# Patient Record
Sex: Male | Born: 1965 | Race: Black or African American | Hispanic: No | Marital: Married | Smoking: Current some day smoker
Health system: Southern US, Community
[De-identification: ages and names within clinical notes are randomized; demographics above are authoritative.]

## PROBLEM LIST (undated history)

## (undated) DIAGNOSIS — G4730 Sleep apnea, unspecified: Secondary | ICD-10-CM

## (undated) DIAGNOSIS — I2510 Atherosclerotic heart disease of native coronary artery without angina pectoris: Secondary | ICD-10-CM

## (undated) DIAGNOSIS — K219 Gastro-esophageal reflux disease without esophagitis: Secondary | ICD-10-CM

## (undated) DIAGNOSIS — R51 Headache: Secondary | ICD-10-CM

## (undated) DIAGNOSIS — K449 Diaphragmatic hernia without obstruction or gangrene: Secondary | ICD-10-CM

## (undated) DIAGNOSIS — I10 Essential (primary) hypertension: Secondary | ICD-10-CM

## (undated) HISTORY — PX: CORONARY ARTERY BYPASS GRAFT: SHX141

## (undated) HISTORY — PX: APPENDECTOMY: SHX54

---

## 2010-09-10 ENCOUNTER — Emergency Department (HOSPITAL_BASED_OUTPATIENT_CLINIC_OR_DEPARTMENT_OTHER)
Admission: EM | Admit: 2010-09-10 | Discharge: 2010-09-11 | Disposition: A | Discharge status: Home / Self Care | Payer: Self-pay | Attending: Emergency Medicine | Admitting: Emergency Medicine

## 2010-09-10 DIAGNOSIS — K219 Gastro-esophageal reflux disease without esophagitis: Secondary | ICD-10-CM | POA: Insufficient documentation

## 2010-09-10 DIAGNOSIS — K449 Diaphragmatic hernia without obstruction or gangrene: Secondary | ICD-10-CM | POA: Insufficient documentation

## 2010-09-10 DIAGNOSIS — E7800 Pure hypercholesterolemia, unspecified: Secondary | ICD-10-CM | POA: Insufficient documentation

## 2010-09-10 DIAGNOSIS — I10 Essential (primary) hypertension: Secondary | ICD-10-CM | POA: Insufficient documentation

## 2010-09-10 DIAGNOSIS — E119 Type 2 diabetes mellitus without complications: Secondary | ICD-10-CM | POA: Insufficient documentation

## 2010-09-10 LAB — DIFFERENTIAL
Basophils Absolute: 0.1 10*3/uL (ref 0.0–0.1)
Basophils Relative: 1 % (ref 0–1)
Eosinophils Absolute: 0.1 10*3/uL (ref 0.0–0.7)
Lymphs Abs: 2.7 10*3/uL (ref 0.7–4.0)
Neutrophils Relative %: 60 % (ref 43–77)

## 2010-09-10 LAB — CBC
MCV: 78.8 fL (ref 78.0–100.0)
Platelets: 335 10*3/uL (ref 150–400)
RBC: 5.24 MIL/uL (ref 4.22–5.81)
WBC: 9.1 10*3/uL (ref 4.0–10.5)

## 2010-09-10 LAB — COMPREHENSIVE METABOLIC PANEL
Alkaline Phosphatase: 162 U/L — ABNORMAL HIGH (ref 39–117)
BUN: 10 mg/dL (ref 6–23)
CO2: 22 mEq/L (ref 19–32)
Chloride: 106 mEq/L (ref 96–112)
Creatinine, Ser: 0.6 mg/dL (ref 0.4–1.5)
GFR calc non Af Amer: >60 mL/min (ref >60)
Total Bilirubin: 1.4 mg/dL — ABNORMAL HIGH (ref 0.3–1.2)

## 2010-09-10 LAB — URINALYSIS, ROUTINE W REFLEX MICROSCOPIC
Leukocytes, UA: NEGATIVE
Nitrite: NEGATIVE
Specific Gravity, Urine: 1.046 — ABNORMAL HIGH (ref 1.005–1.030)
pH: 5.5 (ref 5.0–8.0)

## 2010-09-10 LAB — URINE MICROSCOPIC-ADD ON

## 2011-06-11 ENCOUNTER — Encounter: Payer: Self-pay | Admitting: *Deleted

## 2011-06-11 ENCOUNTER — Emergency Department (INDEPENDENT_AMBULATORY_CARE_PROVIDER_SITE_OTHER): Payer: Self-pay

## 2011-06-11 ENCOUNTER — Inpatient Hospital Stay (HOSPITAL_BASED_OUTPATIENT_CLINIC_OR_DEPARTMENT_OTHER)
Admission: EM | Admit: 2011-06-11 | Discharge: 2011-06-15 | DRG: 683 | Discharge status: Against Medical Advice | Payer: Self-pay | Attending: Internal Medicine | Admitting: Internal Medicine

## 2011-06-11 ENCOUNTER — Other Ambulatory Visit: Payer: Self-pay

## 2011-06-11 DIAGNOSIS — R109 Unspecified abdominal pain: Secondary | ICD-10-CM

## 2011-06-11 DIAGNOSIS — E119 Type 2 diabetes mellitus without complications: Secondary | ICD-10-CM | POA: Diagnosis present

## 2011-06-11 DIAGNOSIS — K7689 Other specified diseases of liver: Secondary | ICD-10-CM

## 2011-06-11 DIAGNOSIS — I2510 Atherosclerotic heart disease of native coronary artery without angina pectoris: Secondary | ICD-10-CM | POA: Diagnosis present

## 2011-06-11 DIAGNOSIS — R935 Abnormal findings on diagnostic imaging of other abdominal regions, including retroperitoneum: Secondary | ICD-10-CM | POA: Diagnosis present

## 2011-06-11 DIAGNOSIS — N50819 Testicular pain, unspecified: Secondary | ICD-10-CM | POA: Diagnosis present

## 2011-06-11 DIAGNOSIS — Z794 Long term (current) use of insulin: Secondary | ICD-10-CM

## 2011-06-11 DIAGNOSIS — D696 Thrombocytopenia, unspecified: Secondary | ICD-10-CM | POA: Diagnosis present

## 2011-06-11 DIAGNOSIS — F17200 Nicotine dependence, unspecified, uncomplicated: Secondary | ICD-10-CM | POA: Diagnosis present

## 2011-06-11 DIAGNOSIS — K219 Gastro-esophageal reflux disease without esophagitis: Secondary | ICD-10-CM | POA: Diagnosis present

## 2011-06-11 DIAGNOSIS — N509 Disorder of male genital organs, unspecified: Secondary | ICD-10-CM | POA: Diagnosis present

## 2011-06-11 DIAGNOSIS — N508 Other specified disorders of male genital organs: Secondary | ICD-10-CM

## 2011-06-11 DIAGNOSIS — E871 Hypo-osmolality and hyponatremia: Secondary | ICD-10-CM | POA: Diagnosis present

## 2011-06-11 DIAGNOSIS — E669 Obesity, unspecified: Secondary | ICD-10-CM | POA: Diagnosis present

## 2011-06-11 DIAGNOSIS — K7460 Unspecified cirrhosis of liver: Secondary | ICD-10-CM | POA: Diagnosis present

## 2011-06-11 DIAGNOSIS — I10 Essential (primary) hypertension: Secondary | ICD-10-CM | POA: Diagnosis present

## 2011-06-11 DIAGNOSIS — K449 Diaphragmatic hernia without obstruction or gangrene: Secondary | ICD-10-CM | POA: Diagnosis present

## 2011-06-11 DIAGNOSIS — G4733 Obstructive sleep apnea (adult) (pediatric): Secondary | ICD-10-CM | POA: Diagnosis present

## 2011-06-11 DIAGNOSIS — N179 Acute kidney failure, unspecified: Principal | ICD-10-CM | POA: Diagnosis present

## 2011-06-11 DIAGNOSIS — R739 Hyperglycemia, unspecified: Secondary | ICD-10-CM

## 2011-06-11 DIAGNOSIS — R509 Fever, unspecified: Secondary | ICD-10-CM | POA: Diagnosis present

## 2011-06-11 DIAGNOSIS — Z951 Presence of aortocoronary bypass graft: Secondary | ICD-10-CM

## 2011-06-11 DIAGNOSIS — IMO0001 Reserved for inherently not codable concepts without codable children: Secondary | ICD-10-CM | POA: Diagnosis present

## 2011-06-11 HISTORY — DX: Essential (primary) hypertension: I10

## 2011-06-11 HISTORY — DX: Diaphragmatic hernia without obstruction or gangrene: K44.9

## 2011-06-11 HISTORY — DX: Sleep apnea, unspecified: G47.30

## 2011-06-11 HISTORY — DX: Gastro-esophageal reflux disease without esophagitis: K21.9

## 2011-06-11 HISTORY — DX: Atherosclerotic heart disease of native coronary artery without angina pectoris: I25.10

## 2011-06-11 HISTORY — DX: Headache: R51

## 2011-06-11 LAB — DIFFERENTIAL
Basophils Absolute: 0 10*3/uL (ref 0.0–0.1)
Basophils Relative: 0 % (ref 0–1)
Eosinophils Absolute: 0 10*3/uL (ref 0.0–0.7)
Eosinophils Relative: 0 % (ref 0–5)
Lymphocytes Relative: 13 % (ref 12–46)
Lymphs Abs: 1.3 10*3/uL (ref 0.7–4.0)
Monocytes Absolute: 1.1 10*3/uL — ABNORMAL HIGH (ref 0.1–1.0)
Monocytes Relative: 11 % (ref 3–12)
Neutro Abs: 7.3 10*3/uL (ref 1.7–7.7)
Neutrophils Relative %: 76 % (ref 43–77)
nRBC: 0 /100 WBC

## 2011-06-11 LAB — CBC
MCHC: 33.2 g/dL (ref 30.0–36.0)
MCV: 82.5 fL (ref 78.0–100.0)
Platelets: 149 10*3/uL — ABNORMAL LOW (ref 150–400)
RDW: 13 % (ref 11.5–15.5)
WBC: 9.7 10*3/uL (ref 4.0–10.5)

## 2011-06-11 LAB — URINALYSIS, ROUTINE W REFLEX MICROSCOPIC
Glucose, UA: >1000 mg/dL — AB
Hgb urine dipstick: NEGATIVE
Leukocytes, UA: NEGATIVE
Protein, ur: NEGATIVE mg/dL
pH: 5 (ref 5.0–8.0)

## 2011-06-11 LAB — CARDIAC PANEL(CRET KIN+CKTOT+MB+TROPI)
CK, MB: 1.8 ng/mL (ref 0.3–4.0)
Relative Index: INVALID (ref 0.0–2.5)
Total CK: 84 U/L (ref 7–232)

## 2011-06-11 LAB — COMPREHENSIVE METABOLIC PANEL
AST: 19 U/L (ref 0–37)
Albumin: 3.7 g/dL (ref 3.5–5.2)
Chloride: 95 mEq/L — ABNORMAL LOW (ref 96–112)
Creatinine, Ser: 0.6 mg/dL (ref 0.50–1.35)
Potassium: 3.9 mEq/L (ref 3.5–5.1)
Total Bilirubin: 0.8 mg/dL (ref 0.3–1.2)
Total Protein: 8.3 g/dL (ref 6.0–8.3)

## 2011-06-11 LAB — GLUCOSE, CAPILLARY
Glucose-Capillary: 347 mg/dL — ABNORMAL HIGH (ref 70–99)
Glucose-Capillary: 212 mg/dL — ABNORMAL HIGH (ref 70–99)

## 2011-06-11 LAB — URINE MICROSCOPIC-ADD ON

## 2011-06-11 MED ORDER — SODIUM CHLORIDE 0.9 % IV BOLUS (SEPSIS): 500.0000 mL | Freq: Once | INTRAVENOUS | Status: DC

## 2011-06-11 MED ORDER — HYDROCODONE-ACETAMINOPHEN 5-500 MG PO TABS: 1.0000 | ORAL_TABLET | Freq: Four times a day (QID) | ORAL | Status: DC | PRN

## 2011-06-11 MED ORDER — IBUPROFEN 800 MG PO TABS
800.0000 mg | ORAL_TABLET | Freq: Once | ORAL | Status: AC
Start: 2011-06-11 — End: 2011-06-11
  Administered 2011-06-11: 800 mg via ORAL
  Filled 2011-06-11: qty 1

## 2011-06-11 MED ORDER — IBUPROFEN 800 MG PO TABS
ORAL_TABLET | ORAL | Status: AC
  Administered 2011-06-11: 800 mg via ORAL
  Filled 2011-06-11: qty 1

## 2011-06-11 MED ORDER — ACETAMINOPHEN 325 MG PO TABS
ORAL_TABLET | ORAL | Status: AC
  Filled 2011-06-11: qty 2

## 2011-06-11 MED ORDER — ACETAMINOPHEN 325 MG PO TABS: 650.0000 mg | ORAL_TABLET | Freq: Once | ORAL | Status: DC

## 2011-06-11 MED ORDER — HYDROMORPHONE HCL PF 1 MG/ML IJ SOLN
1.0000 mg | Freq: Once | INTRAMUSCULAR | Status: AC
  Administered 2011-06-11: 1 mg via INTRAVENOUS
  Filled 2011-06-11: qty 1

## 2011-06-11 MED ORDER — ONDANSETRON HCL 4 MG PO TABS: 4.0000 mg | ORAL_TABLET | Freq: Four times a day (QID) | ORAL | Status: DC

## 2011-06-11 MED ORDER — IBUPROFEN 800 MG PO TABS
800.0000 mg | ORAL_TABLET | Freq: Once | ORAL | Status: AC
  Administered 2011-06-11: 800 mg via ORAL

## 2011-06-11 MED ORDER — SODIUM CHLORIDE 0.9 % IV BOLUS (SEPSIS)
1000.0000 mL | Freq: Once | INTRAVENOUS | Status: AC
  Administered 2011-06-11: 1000 mL via INTRAVENOUS

## 2011-06-11 MED ORDER — ONDANSETRON HCL 4 MG/2ML IJ SOLN
4.0000 mg | Freq: Once | INTRAMUSCULAR | Status: AC
  Administered 2011-06-11: 4 mg via INTRAVENOUS
  Filled 2011-06-11: qty 2

## 2011-06-11 NOTE — ED Notes (Signed)
Beeped regional hospitalist again for dr. Patrica Duel

## 2011-06-11 NOTE — ED Notes (Signed)
2 days of lower abdominal pain and low back pain ..pain radiates into testicle also having nausea pt reports 7 bowel movements since this am loose not diarrhea

## 2011-06-11 NOTE — ED Provider Notes (Addendum)
7:34 PM   Care assumed from Dr. Rosalia Hammers.  Complains of low back pain. Radiation the testicles. Fever of 102.  Nausea, loose stools, diffuse low abd tender. .     Waiting on UA,   CBC and chem normal.  CT  W/o done.  Recommended [place] to eval GB.   US done. Pending.    7:34 PM Results for orders placed during the hospital encounter of 06/11/11  GLUCOSE, CAPILLARY      Component Value Range   Glucose-Capillary 347 (*) 70 - 99 (mg/dL)  CBC      Component Value Range   WBC 9.7  4.0 - 10.5 (K/uL)   RBC 5.59  4.22 - 5.81 (MIL/uL)   Hemoglobin 15.3  13.0 - 17.0 (g/dL)   HCT 08.6  57.8 - 46.9 (%)   MCV 82.5  78.0 - 100.0 (fL)   MCH 27.4  26.0 - 34.0 (pg)   MCHC 33.2  30.0 - 36.0 (g/dL)   RDW 62.9  52.8 - 41.3 (%)   Platelets 149 (*) 150 - 400 (K/uL)  COMPREHENSIVE METABOLIC PANEL      Component Value Range   Sodium 133 (*) 135 - 145 (mEq/L)   Potassium 3.9  3.5 - 5.1 (mEq/L)   Chloride 95 (*) 96 - 112 (mEq/L)   CO2 25  19 - 32 (mEq/L)   Glucose, Bld 394 (*) 70 - 99 (mg/dL)   BUN 8  6 - 23 (mg/dL)   Creatinine, Ser 2.44  0.50 - 1.35 (mg/dL)   Calcium 9.3  8.4 - 01.0 (mg/dL)   Total Protein 8.3  6.0 - 8.3 (g/dL)   Albumin 3.7  3.5 - 5.2 (g/dL)   AST 19  0 - 37 (U/L)   ALT 19  0 - 53 (U/L)   Alkaline Phosphatase 248 (*) 39 - 117 (U/L)   Total Bilirubin 0.8  0.3 - 1.2 (mg/dL)   GFR calc non Af Amer >90  >90 (mL/min)   GFR calc Af Amer >90  >90 (mL/min)  URINALYSIS, ROUTINE W REFLEX MICROSCOPIC      Component Value Range   Color, Urine YELLOW  YELLOW    APPearance CLEAR  CLEAR    Specific Gravity, Urine 1.042 (*) 1.005 - 1.030    pH 5.0  5.0 - 8.0    Glucose, UA >1000 (*) NEGATIVE (mg/dL)   Hgb urine dipstick NEGATIVE  NEGATIVE    Bilirubin Urine NEGATIVE  NEGATIVE    Ketones, ur NEGATIVE  NEGATIVE (mg/dL)   Protein, ur NEGATIVE  NEGATIVE (mg/dL)   Urobilinogen, UA 0.2  0.0 - 1.0 (mg/dL)   Nitrite NEGATIVE  NEGATIVE    Leukocytes, UA NEGATIVE  NEGATIVE   DIFFERENTIAL    Component Value Range   Neutrophils Relative 76  43 - 77 (%)   Lymphocytes Relative 13  12 - 46 (%)   Monocytes Relative 11  3 - 12 (%)   Eosinophils Relative 0  0 - 5 (%)   Basophils Relative 0  0 - 1 (%)   Band Neutrophils 0  0 - 10 (%)   Metamyelocytes Relative 0     Myelocytes 0     Promyelocytes Absolute 0     Blasts 0     nRBC 0  0 (/100 WBC)   Neutro Abs 7.3  1.7 - 7.7 (K/uL)   Lymphs Abs 1.3  0.7 - 4.0 (K/uL)   Monocytes Absolute 1.1 (*) 0.1 - 1.0 (K/uL)   Eosinophils Absolute  0.0  0.0 - 0.7 (K/uL)   Basophils Absolute 0.0  0.0 - 0.1 (K/uL)  LIPASE, BLOOD      Component Value Range   Lipase 20  11 - 59 (U/L)  URINE MICROSCOPIC-ADD ON      Component Value Range   Squamous Epithelial / LPF RARE  RARE    Bacteria, UA RARE  RARE   GLUCOSE, CAPILLARY      Component Value Range   Glucose-Capillary 287 (*) 70 - 99 (mg/dL)  CARDIAC PANEL(CRET KIN+CKTOT+MB+TROPI)      Component Value Range   Total CK 84  7 - 232 (U/L)   CK, MB 1.8  0.3 - 4.0 (ng/mL)   Troponin I <0.30  <0.30 (ng/mL)   Relative Index RELATIVE INDEX IS INVALID  0.0 - 2.5    Ct Abdomen Pelvis Wo Contrast  06/11/2011  *RADIOLOGY REPORT*  Clinical Data: Flank pain radiating to the testicle.  CT ABDOMEN AND PELVIS WITHOUT CONTRAST  Technique:  Multidetector CT imaging of the abdomen and pelvis was performed following the standard protocol without intravenous contrast.  Comparison: None.  Findings: Lung bases are clear.  There is a large calcification near the junction of the inferior vena cava and right atrium. This calcified structure roughly measures 2.6 x 3.1 x 3.6 cm.  It is unclear if the calcification is within the vascular structures. There are calcifications surrounding the lower lobe vessels which could represent old granulomatous disease.  There is no significant pericardial or pleural fluid.  There is mild nodularity of the liver and enlargement of the caudate lobe of the liver.  Findings raise concern for  cirrhosis.  The gallbladder is ill-defined and probably has surrounding fluid or edema.  There is a small amount of fluid around the inferior aspect of the liver and a small amount of fluid in the pelvis.  Normal appearance of the adrenal glands and kidneys.  No evidence for kidney stones or hydronephrosis.  Spleen is normal in morphology and size.  No gross abnormality to the pancreas but there is stranding and prominent lymph nodes in the porta hepatis and surrounding the pancreas.  There is an umbilical and supraumbilical ventral hernia containing fat.  No gross abnormality to the urinary bladder.  Prominent lymph nodes in the right lower quadrant of the abdomen.  Largest node in the right lower mesentery measures 1.7 x 1.6 cm on sequence 93, image 61.  The patient has had a median sternotomy.  No acute bone abnormality.  IMPRESSION: Findings raise concern for cirrhosis.  There is mild nodularity of the liver contour and enlargement of the caudate lobe.  In addition, there is stranding in the porta hepatis with enlarged lymph nodes in the porta hepatis and the right lower abdominal mesentery.  Stranding or edema around the gallbladder.  Findings could be secondary to cirrhosis but cannot exclude acute gallbladder inflammation.  There is a large calcification positioned near the junction of the right atrium and inferior vena cava.  It is unclear if this calcification is intravascular or extrinsic to the vascular structures.  Differential could represent chronic thrombus versus old granulomatous disease.  The liver and gallbladder findings may be further evaluated with an abdominal ultrasound and  Doppler.  Alternately, the liver, hepatic veins and IVC may be further evaluated with a multiphase MRI of the liver.  Ventral hernias containing fat.  Original Report Authenticated By: Richarda Overlie, M.D.   US Abdomen Complete  06/11/2011  *RADIOLOGY REPORT*  Clinical Data:  Abdominal pain  ABDOMINAL ULTRASOUND COMPLETE   Comparison:  CT earlier today  Findings:  Gallbladder:  The gallbladder wall is significantly thickened, measuring up to 7.5 mm.  No gallstones or pericholecystic fluid.  Common Bile Duct:  Within normal limits in caliber.  Liver: The liver is diffusely nodular in echogenicity and contour. No obvious focal mass.  IVC:  Appears normal.  Pancreas:  Obscured by overlying bowel gas  Spleen:  Within normal limits in size and echotexture.  Right kidney:  Normal in size and parenchymal echogenicity.  No evidence of mass or hydronephrosis.  Left kidney:  Normal in size and parenchymal echogenicity.  No evidence of mass or hydronephrosis.  Abdominal Aorta:  Obscured by overlying bowel gas.  IMPRESSION: Limited visualization of the pancreas and aorta.  Nonspecific gallbladder wall thickening.  Nodular and heterogeneous liver worrisome for diffuse hepatic cellular disease or cirrhosis.  Original Report Authenticated By: Donavan Burnet, M.D.      7:34 PM Patient is reassessed in the room. He remains stable, ultrasound, and CAT scan have been discussed with him. [facility] Dr[facility] [place], we'll try to arrange transfer there for further evaluation. Urine analysis shows no sign of any infection but shows likely poorly controlled diabetes  7:34 PM   7:34 PM  Dr Jordan Hawks  Pager #  [phone]  Discuss with the [facility] hospitalist. Of admission pending based on their requests for chest x-ray. Cardiac markers and EKG. This has been ordered, will call back Dr. Jordan Hawks. When these test results   7:34 PM  Date: 06/11/2011  Rate: 103  Rhythm: sinus tachycardia  QRS Axis: normal  Intervals: normal  ST/T Wave abnormalities: nonspecific T wave changes  Conduction Disutrbances:none  Narrative Interpretation:   Old EKG Reviewed: none available  7:34 PM Results for orders placed during the hospital encounter of 06/11/11  GLUCOSE, CAPILLARY      Component Value Range   Glucose-Capillary  347 (*) 70 - 99 (mg/dL)  CBC      Component Value Range   WBC 9.7  4.0 - 10.5 (K/uL)   RBC 5.59  4.22 - 5.81 (MIL/uL)   Hemoglobin 15.3  13.0 - 17.0 (g/dL)   HCT 45.4  09.8 - 11.9 (%)   MCV 82.5  78.0 - 100.0 (fL)   MCH 27.4  26.0 - 34.0 (pg)   MCHC 33.2  30.0 - 36.0 (g/dL)   RDW 14.7  82.9 - 56.2 (%)   Platelets 149 (*) 150 - 400 (K/uL)  COMPREHENSIVE METABOLIC PANEL      Component Value Range   Sodium 133 (*) 135 - 145 (mEq/L)   Potassium 3.9  3.5 - 5.1 (mEq/L)   Chloride 95 (*) 96 - 112 (mEq/L)   CO2 25  19 - 32 (mEq/L)   Glucose, Bld 394 (*) 70 - 99 (mg/dL)   BUN 8  6 - 23 (mg/dL)   Creatinine, Ser 1.30  0.50 - 1.35 (mg/dL)   Calcium 9.3  8.4 - 86.5 (mg/dL)   Total Protein 8.3  6.0 - 8.3 (g/dL)   Albumin 3.7  3.5 - 5.2 (g/dL)   AST 19  0 - 37 (U/L)   ALT 19  0 - 53 (U/L)   Alkaline Phosphatase 248 (*) 39 - 117 (U/L)   Total Bilirubin 0.8  0.3 - 1.2 (mg/dL)   GFR calc non Af Amer >90  >90 (mL/min)   GFR calc Af Amer >90  >90 (mL/min)  URINALYSIS, ROUTINE W REFLEX MICROSCOPIC      Component Value Range   Color, Urine YELLOW  YELLOW    APPearance CLEAR  CLEAR    Specific Gravity, Urine 1.042 (*) 1.005 - 1.030    pH 5.0  5.0 - 8.0    Glucose, UA >1000 (*) NEGATIVE (mg/dL)   Hgb urine dipstick NEGATIVE  NEGATIVE    Bilirubin Urine NEGATIVE  NEGATIVE    Ketones, ur NEGATIVE  NEGATIVE (mg/dL)   Protein, ur NEGATIVE  NEGATIVE (mg/dL)   Urobilinogen, UA 0.2  0.0 - 1.0 (mg/dL)   Nitrite NEGATIVE  NEGATIVE    Leukocytes, UA NEGATIVE  NEGATIVE   DIFFERENTIAL      Component Value Range   Neutrophils Relative 76  43 - 77 (%)   Lymphocytes Relative 13  12 - 46 (%)   Monocytes Relative 11  3 - 12 (%)   Eosinophils Relative 0  0 - 5 (%)   Basophils Relative 0  0 - 1 (%)   Band Neutrophils 0  0 - 10 (%)   Metamyelocytes Relative 0     Myelocytes 0     Promyelocytes Absolute 0     Blasts 0     nRBC 0  0 (/100 WBC)   Neutro Abs 7.3  1.7 - 7.7 (K/uL)   Lymphs Abs 1.3  0.7 -  4.0 (K/uL)   Monocytes Absolute 1.1 (*) 0.1 - 1.0 (K/uL)   Eosinophils Absolute 0.0  0.0 - 0.7 (K/uL)   Basophils Absolute 0.0  0.0 - 0.1 (K/uL)  LIPASE, BLOOD      Component Value Range   Lipase 20  11 - 59 (U/L)  URINE MICROSCOPIC-ADD ON      Component Value Range   Squamous Epithelial / LPF RARE  RARE    Bacteria, UA RARE  RARE   GLUCOSE, CAPILLARY      Component Value Range   Glucose-Capillary 287 (*) 70 - 99 (mg/dL)  CARDIAC PANEL(CRET KIN+CKTOT+MB+TROPI)      Component Value Range   Total CK 84  7 - 232 (U/L)   CK, MB 1.8  0.3 - 4.0 (ng/mL)   Troponin I <0.30  <0.30 (ng/mL)   Relative Index RELATIVE INDEX IS INVALID  0.0 - 2.5    Ct Abdomen Pelvis Wo Contrast  06/11/2011  *RADIOLOGY REPORT*  Clinical Data: Flank pain radiating to the testicle.  CT ABDOMEN AND PELVIS WITHOUT CONTRAST  Technique:  Multidetector CT imaging of the abdomen and pelvis was performed following the standard protocol without intravenous contrast.  Comparison: None.  Findings: Lung bases are clear.  There is a large calcification near the junction of the inferior vena cava and right atrium. This calcified structure roughly measures 2.6 x 3.1 x 3.6 cm.  It is unclear if the calcification is within the vascular structures. There are calcifications surrounding the lower lobe vessels which could represent old granulomatous disease.  There is no significant pericardial or pleural fluid.  There is mild nodularity of the liver and enlargement of the caudate lobe of the liver.  Findings raise concern for cirrhosis.  The gallbladder is ill-defined and probably has surrounding fluid or edema.  There is a small amount of fluid around the inferior aspect of the liver and a small amount of fluid in the pelvis.  Normal appearance of the adrenal glands and kidneys.  No evidence for kidney stones or hydronephrosis.  Spleen is normal in morphology and size.  No gross abnormality  to the pancreas but there is stranding and prominent  lymph nodes in the porta hepatis and surrounding the pancreas.  There is an umbilical and supraumbilical ventral hernia containing fat.  No gross abnormality to the urinary bladder.  Prominent lymph nodes in the right lower quadrant of the abdomen.  Largest node in the right lower mesentery measures 1.7 x 1.6 cm on sequence 93, image 61.  The patient has had a median sternotomy.  No acute bone abnormality.  IMPRESSION: Findings raise concern for cirrhosis.  There is mild nodularity of the liver contour and enlargement of the caudate lobe.  In addition, there is stranding in the porta hepatis with enlarged lymph nodes in the porta hepatis and the right lower abdominal mesentery.  Stranding or edema around the gallbladder.  Findings could be secondary to cirrhosis but cannot exclude acute gallbladder inflammation.  There is a large calcification positioned near the junction of the right atrium and inferior vena cava.  It is unclear if this calcification is intravascular or extrinsic to the vascular structures.  Differential could represent chronic thrombus versus old granulomatous disease.  The liver and gallbladder findings may be further evaluated with an abdominal ultrasound and  Doppler.  Alternately, the liver, hepatic veins and IVC may be further evaluated with a multiphase MRI of the liver.  Ventral hernias containing fat.  Original Report Authenticated By: Richarda Overlie, M.D.   Dg Chest 2 View  06/11/2011  *RADIOLOGY REPORT*  Clinical Data: Abdominal pain for 2 days  CHEST - 2 VIEW  Comparison: CT abdomen pelvis 06/11/2011  Findings: There are changes of median sternotomy. The second and third median sternotomy wires are fractured. Lung volumes are low, with crowding of pulmonary vascularity.  Heart size is within normal limits. The lungs are clear.  There is no pleural effusion or pneumothorax.  Visualized osseous structures are unremarkable.  IMPRESSION: Low lung volumes.  No acute findings.  Original Report  Authenticated By: Britta Mccreedy, M.D.   US Abdomen Complete  06/11/2011  *RADIOLOGY REPORT*  Clinical Data:  Abdominal pain  ABDOMINAL ULTRASOUND COMPLETE  Comparison:  CT earlier today  Findings:  Gallbladder:  The gallbladder wall is significantly thickened, measuring up to 7.5 mm.  No gallstones or pericholecystic fluid.  Common Bile Duct:  Within normal limits in caliber.  Liver: The liver is diffusely nodular in echogenicity and contour. No obvious focal mass.  IVC:  Appears normal.  Pancreas:  Obscured by overlying bowel gas  Spleen:  Within normal limits in size and echotexture.  Right kidney:  Normal in size and parenchymal echogenicity.  No evidence of mass or hydronephrosis.  Left kidney:  Normal in size and parenchymal echogenicity.  No evidence of mass or hydronephrosis.  Abdominal Aorta:  Obscured by overlying bowel gas.  IMPRESSION: Limited visualization of the pancreas and aorta.  Nonspecific gallbladder wall thickening.  Nodular and heterogeneous liver worrisome for diffuse hepatic cellular disease or cirrhosis.  Original Report Authenticated By: Donavan Burnet, M.D.    7:35 PM Discussed with [facility].  Pt has been accepted for admission/transfer.  Stable.   Dr Jordan Hawks, Team 3, Med Surg  Dx. 1.  Abd pain 2.  Cirrhosis 3.  Poorly controlled DM 4.  Fever   Dolores Mcgovern A. Patrica Duel, MD 06/11/11 1943   Remains stable, has been accepted to [facility]. For further evaluation  Blayne Garlick A. Patrica Duel, MD 06/11/11 1950  10:58 PM [facility] is finally here to transport the patient to [facility]. He was  given an additional ibuprofen dose for fever, acetaminophen, avoided due to the cirrhosis. He is also given additional fluid bolus for her persistent tachycardia. As discussed with [facility]. He will likely need a surgical consultation in regard to the abdominal pain, and the abnormal ultrasound. However, he remained stable upon time of transport and will be going to [facility] for further evaluation and  treatment.  Laylanie Kruczek A. Patrica Duel, MD 06/11/11 2258

## 2011-06-11 NOTE — ED Provider Notes (Signed)
History     CSN: [id number] Arrival date & time: 06/11/2011  1:23 PM   First MD Initiated Contact with Patient 06/11/11 1341      Chief Complaint  Patient presents with  . Flank Pain  . Abdominal Pain  . Nausea    (Consider location/radiation/quality/duration/timing/severity/associated sxs/prior treatment) HPI Patient states lower abdomen hurting since yesterday.  No vomiting some nausea.  Low back radiating around to front bilaterally- sharp pain.  Constant- increases with nothing, improves with nothing.  No home meds.  Patient has had multiple loose bowel movements.  Increased frequency of urination.  BS high at 347.  Denies penile discharge, no h.o. Std.   [facility]MD is [facility].   Past Medical History  Diagnosis Date  . Hypertension   . Diabetes mellitus   . Coronary artery disease   . Hiatal hernia     Past Surgical History  Procedure Date  . Coronary artery bypass graft     History reviewed. No pertinent family history.  History  Substance Use Topics  . Smoking status: Current Some Day Smoker  . Smokeless tobacco: Not on file  . Alcohol Use: Yes     occ      Review of Systems  All other systems reviewed and are negative.    Allergies  Review of patient's allergies indicates no known allergies.  Home Medications   Current Outpatient Rx  Name Route Sig Dispense Refill  . CANDESARTAN CILEXETIL 16 MG PO TABS Oral Take 16 mg by mouth daily.      Marland Kitchen GLIPIZIDE 10 MG PO TABS Oral Take 10 mg by mouth 2 (two) times daily before a meal.      . METFORMIN HCL 1000 MG PO TABS Oral Take 1,000 mg by mouth 2 (two) times daily with a meal.        BP 147/79  Pulse 115  Temp(Src) 102.5 F (39.2 C) (Oral)  Resp 20  SpO2 98%  Physical Exam  Constitutional: He is oriented to person, place, and time. He appears well-developed and well-nourished.  HENT:  Head: Normocephalic and atraumatic.  Right Ear: External ear normal.  Left Ear: External ear  normal.  Eyes: Conjunctivae are normal. Pupils are equal, round, and reactive to light.  Neck: Normal range of motion. Neck supple.  Cardiovascular: Normal rate and regular rhythm.   Pulmonary/Chest: Effort normal and breath sounds normal.  Abdominal: Soft. There is tenderness.       Diffuse lower abdominal pain  Genitourinary: Prostate normal and penis normal.  Musculoskeletal: Normal range of motion.  Neurological: He is alert and oriented to person, place, and time.  Skin: Skin is warm and dry.  Psychiatric: He has a normal mood and affect.    ED Course  Procedures (including critical care time)  Labs Reviewed  GLUCOSE, CAPILLARY - Abnormal; Notable for the following:    Glucose-Capillary 347 (*)    All other components within normal limits  CBC  COMPREHENSIVE METABOLIC PANEL   No results found.   No diagnosis found.    MDM  Patient care signed out to Dr. Patrica Duel.         Hilario Quarry, MD 06/16/11 [phone]

## 2011-06-11 NOTE — ED Notes (Signed)
CBG was done at 21:38

## 2011-06-11 NOTE — ED Notes (Signed)
Beeped ----regional hospitalist for dr. Patrica Duel

## 2011-06-11 NOTE — ED Notes (Signed)
repaged hospitalist at regional for dr. Patrica Duel

## 2011-06-12 ENCOUNTER — Encounter (HOSPITAL_COMMUNITY): Payer: Self-pay | Admitting: *Deleted

## 2011-06-12 ENCOUNTER — Inpatient Hospital Stay (HOSPITAL_COMMUNITY): Payer: Self-pay

## 2011-06-12 DIAGNOSIS — I10 Essential (primary) hypertension: Secondary | ICD-10-CM | POA: Diagnosis present

## 2011-06-12 DIAGNOSIS — N50819 Testicular pain, unspecified: Secondary | ICD-10-CM | POA: Diagnosis present

## 2011-06-12 DIAGNOSIS — R509 Fever, unspecified: Secondary | ICD-10-CM | POA: Diagnosis present

## 2011-06-12 DIAGNOSIS — E119 Type 2 diabetes mellitus without complications: Secondary | ICD-10-CM | POA: Diagnosis present

## 2011-06-12 DIAGNOSIS — K7460 Unspecified cirrhosis of liver: Secondary | ICD-10-CM | POA: Diagnosis present

## 2011-06-12 DIAGNOSIS — R109 Unspecified abdominal pain: Secondary | ICD-10-CM | POA: Diagnosis present

## 2011-06-12 DIAGNOSIS — R935 Abnormal findings on diagnostic imaging of other abdominal regions, including retroperitoneum: Secondary | ICD-10-CM | POA: Diagnosis present

## 2011-06-12 LAB — COMPREHENSIVE METABOLIC PANEL
ALT: 16 U/L (ref 0–53)
Albumin: 3 g/dL — ABNORMAL LOW (ref 3.5–5.2)
Alkaline Phosphatase: 153 U/L — ABNORMAL HIGH (ref 39–117)
Glucose, Bld: 291 mg/dL — ABNORMAL HIGH (ref 70–99)
Total Protein: 7 g/dL (ref 6.0–8.3)

## 2011-06-12 LAB — CBC
HCT: 43.4 % (ref 39.0–52.0)
MCHC: 33.6 g/dL (ref 30.0–36.0)
MCV: 83.6 fL (ref 78.0–100.0)
RDW: 13.1 % (ref 11.5–15.5)

## 2011-06-12 LAB — GLUCOSE, CAPILLARY
Glucose-Capillary: 256 mg/dL — ABNORMAL HIGH (ref 70–99)
Glucose-Capillary: 259 mg/dL — ABNORMAL HIGH (ref 70–99)
Glucose-Capillary: 275 mg/dL — ABNORMAL HIGH (ref 70–99)
Glucose-Capillary: 285 mg/dL — ABNORMAL HIGH (ref 70–99)
Glucose-Capillary: 366 mg/dL — ABNORMAL HIGH (ref 70–99)

## 2011-06-12 LAB — HEMOGLOBIN A1C: Hgb A1c MFr Bld: 12.4 % — ABNORMAL HIGH (ref <5.7)

## 2011-06-12 MED ORDER — TECHNETIUM TC 99M MEBROFENIN IV KIT
5.0000 | PACK | Freq: Once | INTRAVENOUS | Status: AC | PRN
  Administered 2011-06-12: 5 via INTRAVENOUS

## 2011-06-12 MED ORDER — GUAIFENESIN-DM 100-10 MG/5ML PO SYRP
5.0000 mL | ORAL_SOLUTION | ORAL | Status: DC | PRN
  Filled 2011-06-12: qty 5

## 2011-06-12 MED ORDER — ALBUTEROL SULFATE (5 MG/ML) 0.5% IN NEBU
2.5000 mg | INHALATION_SOLUTION | RESPIRATORY_TRACT | Status: DC | PRN
  Filled 2011-06-12: qty 0.5

## 2011-06-12 MED ORDER — INSULIN ASPART 100 UNIT/ML ~~LOC~~ SOLN
0.0000 [IU] | SUBCUTANEOUS | Status: DC
  Administered 2011-06-12: 7 [IU] via SUBCUTANEOUS
  Administered 2011-06-12: 5 [IU] via SUBCUTANEOUS
  Administered 2011-06-12: 9 [IU] via SUBCUTANEOUS
  Administered 2011-06-12: 5 [IU] via SUBCUTANEOUS
  Administered 2011-06-13: 2 [IU] via SUBCUTANEOUS
  Administered 2011-06-13 (×2): 7 [IU] via SUBCUTANEOUS
  Administered 2011-06-13: 2 [IU] via SUBCUTANEOUS
  Administered 2011-06-13 – 2011-06-14 (×2): 5 [IU] via SUBCUTANEOUS
  Administered 2011-06-14: 3 [IU] via SUBCUTANEOUS
  Administered 2011-06-14: 2 [IU] via SUBCUTANEOUS
  Administered 2011-06-14 – 2011-06-15 (×7): 3 [IU] via SUBCUTANEOUS
  Filled 2011-06-12 (×3): qty 3

## 2011-06-12 MED ORDER — ONDANSETRON HCL 4 MG PO TABS: 4.0000 mg | ORAL_TABLET | Freq: Four times a day (QID) | ORAL | Status: DC | PRN

## 2011-06-12 MED ORDER — ONDANSETRON HCL 4 MG/2ML IJ SOLN
4.0000 mg | Freq: Four times a day (QID) | INTRAMUSCULAR | Status: DC | PRN
  Filled 2011-06-12: qty 2

## 2011-06-12 MED ORDER — GADOBENATE DIMEGLUMINE 529 MG/ML IV SOLN
20.0000 mL | Freq: Once | INTRAVENOUS | Status: AC | PRN
  Administered 2011-06-12: 20 mL via INTRAVENOUS

## 2011-06-12 MED ORDER — MORPHINE SULFATE 2 MG/ML IJ SOLN
2.0000 mg | INTRAMUSCULAR | Status: DC | PRN
  Administered 2011-06-12: 2 mg via INTRAVENOUS
  Filled 2011-06-12: qty 1

## 2011-06-12 MED ORDER — HYDROMORPHONE HCL PF 1 MG/ML IJ SOLN
1.0000 mg | INTRAMUSCULAR | Status: DC | PRN
  Administered 2011-06-12 – 2011-06-15 (×11): 1 mg via INTRAVENOUS
  Filled 2011-06-12 (×12): qty 1

## 2011-06-12 MED ORDER — HYDROMORPHONE HCL PF 1 MG/ML IJ SOLN
INTRAMUSCULAR | Status: AC
  Administered 2011-06-12: 14:00:00
  Filled 2011-06-12: qty 1

## 2011-06-12 MED ORDER — SODIUM CHLORIDE 0.9 % IV SOLN
INTRAVENOUS | Status: AC
  Administered 2011-06-12: 04:00:00 via INTRAVENOUS

## 2011-06-12 MED ORDER — PIPERACILLIN-TAZOBACTAM 3.375 G IVPB 30 MIN
3.3750 g | Freq: Three times a day (TID) | INTRAVENOUS | Status: DC
  Administered 2011-06-12 – 2011-06-13 (×6): 3.375 g via INTRAVENOUS
  Filled 2011-06-12 (×8): qty 50

## 2011-06-12 MED ORDER — ALUM & MAG HYDROXIDE-SIMETH 200-200-20 MG/5ML PO SUSP
30.0000 mL | Freq: Four times a day (QID) | ORAL | Status: DC | PRN
  Administered 2011-06-15: 30 mL via ORAL
  Filled 2011-06-12 (×3): qty 30

## 2011-06-12 MED ORDER — MAGNESIUM OXIDE 400 MG PO TABS
400.0000 mg | ORAL_TABLET | Freq: Two times a day (BID) | ORAL | Status: DC
  Administered 2011-06-12 – 2011-06-14 (×5): 400 mg via ORAL
  Filled 2011-06-12 (×7): qty 1

## 2011-06-12 MED ORDER — ACETAMINOPHEN 325 MG PO TABS
650.0000 mg | ORAL_TABLET | Freq: Four times a day (QID) | ORAL | Status: DC | PRN
  Administered 2011-06-12: 650 mg via ORAL
  Filled 2011-06-12 (×3): qty 2

## 2011-06-12 MED ORDER — INSULIN GLARGINE 100 UNIT/ML ~~LOC~~ SOLN
5.0000 [IU] | Freq: Every day | SUBCUTANEOUS | Status: DC
  Administered 2011-06-12 – 2011-06-13 (×2): 5 [IU] via SUBCUTANEOUS
  Filled 2011-06-12 (×2): qty 3

## 2011-06-12 MED ORDER — OLMESARTAN MEDOXOMIL 40 MG PO TABS
40.0000 mg | ORAL_TABLET | Freq: Every day | ORAL | Status: DC
  Administered 2011-06-12 – 2011-06-13 (×2): 40 mg via ORAL
  Filled 2011-06-12 (×3): qty 1

## 2011-06-12 MED ORDER — ACETAMINOPHEN 650 MG RE SUPP
650.0000 mg | Freq: Four times a day (QID) | RECTAL | Status: DC | PRN
  Filled 2011-06-12: qty 1

## 2011-06-12 NOTE — H&P (Signed)
PCP:  [facility]?  Chief Complaint:  Abdominal pain radiating to testicles bilateraly  HPI: Marcus Vance is a 45 y.o. male   has a past medical history of Hypertension; Diabetes mellitus; Coronary artery disease; Hiatal hernia; Sleep apnea; GERD (gastroesophageal reflux disease); and Headache.   Presented with  For past 2 days patient had had abdominal pain in the lower part of his abdomen. His Friday 84 lower back as well as testicles bilaterally he also had had some mild nausea associated this. He was not aware of it but upon presentation to [facility] emergency department he was found to be febrile to 102.3. He had not had any vomiting he had not had right upper quadrant pain. Had not had burning with urination. His urine appear to be an uninfected. CT scan did show some gallbladder wall thickening. Chest x-ray was unremarkable Review of Systems:    Pertinent Positives: Nausea, abdominal pain  Pertinent Negatives: No vomiting, no diarrhea, no urinary complaints, no neurological complaints Otherwise ROS are negative except for above, 10 systems were reviewed  Past Medical History: Past Medical History  Diagnosis Date  . Hypertension   . Diabetes mellitus   . Coronary artery disease   . Hiatal hernia   . Sleep apnea   . GERD (gastroesophageal reflux disease)   . Headache    Past Surgical History  Procedure Date  . Coronary artery bypass graft   . Appendectomy      Medications: Prior to Admission medications   Medication Sig Start Date End Date Taking? Authorizing Provider  candesartan (ATACAND) 16 MG tablet Take 16 mg by mouth daily.     Yes Historical Provider, MD  glipiZIDE (GLUCOTROL) 10 MG tablet Take 10 mg by mouth 2 (two) times daily before a meal.     Yes Historical Provider, MD  metFORMIN (GLUCOPHAGE) 500 MG tablet Take 500 mg by mouth 2 (two) times daily with a meal.     Yes Historical Provider, MD    Allergies:  No Known Allergies  Social History:  reports that he has been smoking.  He does not have any smokeless tobacco history on file. He reports that he drinks about 1.8 ounces of alcohol per week. He reports that he does not use illicit drugs.   Family History: family history includes COPD in his father; Diabetes in his mother; and Hypertension in his mother.    Physical Exam: Patient Vitals for the past 24 hrs:  BP Temp Temp src Pulse Resp SpO2 Height Weight  06/12/11 0010 126/83 mmHg 99.4 F (37.4 C) Oral 123  18  93 % 6' (1.829 m) 137.7 kg (303 lb 9.2 oz)  06/11/11 2312 - 101.7 F (38.7 C) Oral - - - - -  06/11/11 2256 113/79 mmHg - - - 20  97 % - -  06/11/11 2119 118/82 mmHg 100.7 F (38.2 C) Oral 104  20  97 % - -  06/11/11 1956 114/58 mmHg 98.9 F (37.2 C) Oral 90  20  99 % - -  06/11/11 1651 108/58 mmHg 99.3 F (37.4 C) Oral 94  18  99 % - -  06/11/11 1448 106/56 mmHg 102.9 F (39.4 C) Oral 105  22  96 % - -  06/11/11 1314 - 102.5 F (39.2 C) - - - - - -  06/11/11 1255 147/79 mmHg 102 F (38.9 C) Oral 115  20  98 % - -     Alert and Oriented in No Acute distress  Mucous Membranes and Skin: Moist mucous membranes normal skin turgor Head Non traumatic neck supple Heart: Regular rate and rhythm no murmurs appreciated Lungs: Clear to auscultation bilaterally but somewhat distant Abdomen: Obese there is a slight left lower quadrant tenderness no right upper quadrant mass tenderness negative Murphy sign, obese  lower extremities: No clubbing cyanosis or edema Neurologically Grossly intact:  Skin clean Dry and intact no rash:  body mass index is 41.17 kg/(m^2).   Labs on Admission:   [facility] 06/11/11 1340  NA 133*  K 3.9  CL 95*  CO2 25  GLUCOSE 394*  BUN 8  CREATININE 0.60  CALCIUM 9.3  MG --  PHOS --    Basename 06/11/11 1340  AST 19  ALT 19  ALKPHOS 248*  BILITOT 0.8  PROT 8.3  ALBUMIN 3.7    Basename 06/11/11 1340  LIPASE 20  AMYLASE --    Basename 06/11/11 1340  WBC 9.7  NEUTROABS  7.3  HGB 15.3  HCT 46.1  MCV 82.5  PLT 149*    Basename 06/11/11 1846  CKTOTAL 84  CKMB 1.8  CKMBINDEX --  TROPONINI <0.30   No results found for this basename: TSH,T4TOTAL,FREET3,T3FREE,THYROIDAB in the last 72 hours No results found for this basename: VITAMINB12:2,FOLATE:2,FERRITIN:2,TIBC:2,IRON:2,RETICCTPCT:2 in the last 72 hours No results found for this basename: HGBA1C    Estimated Creatinine Clearance: 171.1 ml/min (by C-G formula based on Cr of 0.6). ABG No results found for this basename: phart, pco2, po2, hco3, tco2, acidbasedef, o2sat     No results found for this basename: DDIMER      Blood Culture No results found for this basename: sdes, specrequest, cult, reptstatus       Radiological Exams on Admission: Ct Abdomen Pelvis Wo Contrast  06/11/2011  *RADIOLOGY REPORT*  Clinical Data: Flank pain radiating to the testicle.  CT ABDOMEN AND PELVIS WITHOUT CONTRAST  Technique:  Multidetector CT imaging of the abdomen and pelvis was performed following the standard protocol without intravenous contrast.  Comparison: None.  Findings: Lung bases are clear.  There is a large calcification near the junction of the inferior vena cava and right atrium. This calcified structure roughly measures 2.6 x 3.1 x 3.6 cm.  It is unclear if the calcification is within the vascular structures. There are calcifications surrounding the lower lobe vessels which could represent old granulomatous disease.  There is no significant pericardial or pleural fluid.  There is mild nodularity of the liver and enlargement of the caudate lobe of the liver.  Findings raise concern for cirrhosis.  The gallbladder is ill-defined and probably has surrounding fluid or edema.  There is a small amount of fluid around the inferior aspect of the liver and a small amount of fluid in the pelvis.  Normal appearance of the adrenal glands and kidneys.  No evidence for kidney stones or hydronephrosis.  Spleen is normal in  morphology and size.  No gross abnormality to the pancreas but there is stranding and prominent lymph nodes in the porta hepatis and surrounding the pancreas.  There is an umbilical and supraumbilical ventral hernia containing fat.  No gross abnormality to the urinary bladder.  Prominent lymph nodes in the right lower quadrant of the abdomen.  Largest node in the right lower mesentery measures 1.7 x 1.6 cm on sequence 93, image 61.  The patient has had a median sternotomy.  No acute bone abnormality.  IMPRESSION: Findings raise concern for cirrhosis.  There is mild nodularity of the liver  contour and enlargement of the caudate lobe.  In addition, there is stranding in the porta hepatis with enlarged lymph nodes in the porta hepatis and the right lower abdominal mesentery.  Stranding or edema around the gallbladder.  Findings could be secondary to cirrhosis but cannot exclude acute gallbladder inflammation.  There is a large calcification positioned near the junction of the right atrium and inferior vena cava.  It is unclear if this calcification is intravascular or extrinsic to the vascular structures.  Differential could represent chronic thrombus versus old granulomatous disease.  The liver and gallbladder findings may be further evaluated with an abdominal ultrasound and  Doppler.  Alternately, the liver, hepatic veins and IVC may be further evaluated with a multiphase MRI of the liver.  Ventral hernias containing fat.  Original Report Authenticated By: Richarda Overlie, M.D.   Dg Chest 2 View  06/11/2011  *RADIOLOGY REPORT*  Clinical Data: Abdominal pain for 2 days  CHEST - 2 VIEW  Comparison: CT abdomen pelvis 06/11/2011  Findings: There are changes of median sternotomy. The second and third median sternotomy wires are fractured. Lung volumes are low, with crowding of pulmonary vascularity.  Heart size is within normal limits. The lungs are clear.  There is no pleural effusion or pneumothorax.  Visualized osseous  structures are unremarkable.  IMPRESSION: Low lung volumes.  No acute findings.  Original Report Authenticated By: Britta Mccreedy, M.D.   US Abdomen Complete  06/11/2011  *RADIOLOGY REPORT*  Clinical Data:  Abdominal pain  ABDOMINAL ULTRASOUND COMPLETE  Comparison:  CT earlier today  Findings:  Gallbladder:  The gallbladder wall is significantly thickened, measuring up to 7.5 mm.  No gallstones or pericholecystic fluid.  Common Bile Duct:  Within normal limits in caliber.  Liver: The liver is diffusely nodular in echogenicity and contour. No obvious focal mass.  IVC:  Appears normal.  Pancreas:  Obscured by overlying bowel gas  Spleen:  Within normal limits in size and echotexture.  Right kidney:  Normal in size and parenchymal echogenicity.  No evidence of mass or hydronephrosis.  Left kidney:  Normal in size and parenchymal echogenicity.  No evidence of mass or hydronephrosis.  Abdominal Aorta:  Obscured by overlying bowel gas.  IMPRESSION: Limited visualization of the pancreas and aorta.  Nonspecific gallbladder wall thickening.  Nodular and heterogeneous liver worrisome for diffuse hepatic cellular disease or cirrhosis.  Original Report Authenticated By: Donavan Burnet, M.D.    Assessment/Plan  Present on Admission:  .Fever - unclear etiology at this point the urine clear in all abdomen some pneumonia ultrasound did not show evidence of cholecystitis per se although there is some thickening of the bladder wall. Obtain blood cultures cover broadly with Zosyn at this point  .Abdominal pain of unknown etiology - CT scan did not show anything that could explain and abdominal pain  .Testicular pain - etiology not clear to obtain ultrasound of the scrotum. Zosyn should cover epididymitis  .possible Cholecystitis (without mention of calculus) - wall of gallbladder thickening etiology not clear at this point we'll order HIDA scan .Cirrhosis - patient denies alcohol abuse given his obesity this could be  secondary to [place]. Will also obtain hepatitis serologies  .Diabetes mellitus - hold glipizide hold metformin after CT scan continue sliding scale  .Hypertension - continue home meds     Prophylaxis: SCDs  CODE STATUS: Full code   Monea Pesantez 06/12/2011, 3:01 AM

## 2011-06-12 NOTE — Progress Notes (Addendum)
PROGRESS NOTE  Marcus Vance WUJ:[id number] DOB: 1965/12/05 DOA: 06/11/2011 PCP: [facility]  Brief narrative: 45 year old man presents the emergency department with 2 days of lower count and low back pain radiating into his testicle, nausea, diarrhea, fever. Emergency department impression was: abdominal pain, cirrhosis, fever 102.3. The patient was transferred to [facility] for further evaluation. Exam was notable for slight left lower quadrant tenderness. No right upper quadrant tenderness. CT of the abdomen and pelvis was concerning for cirrhosis, edema surrounding the gallbladder, large calcification near the junction of the inferior vena cava and right atrium, prominent lymph nodes in the porta hepatis and surrounding the pancreas, prominent lymph nodes right lower quadrant of the abdomen.  Past medical history: Hypertension, diabetes mellitus, hiatal hernia, obstructive sleep apnea, GERD  Consultants:  None  Procedures:  None  Interim History: Interim documentation reviewed. Subjective: He continues to have midline lower abdominal pain, suprapubic pain. He is able to ambulate without difficulty. He has nausea. He complains of diarrhea.  Objective: Filed Vitals:   06/12/11 0613  BP: 130/84  Pulse: 101  Temp: 98.4 F (36.9 C)  Resp: 20   Blood pressure 130/84, pulse 101, temperature 98.4 F (36.9 C), temperature source Oral, resp. rate 20, height 6' (1.829 m), weight 137.7 kg (303 lb 9.2 oz), SpO2 96.00%.  No intake or output data in the 24 hours ending 06/12/11 0842  Exam: General: Calm. He appears mildly uncomfortable. Lying flat in bed. Cardiovascular: Tachycardic, regular rhythm. No murmur, rub or gallop. No lower extremity edema. Respiratory: Clear to auscultation bilaterally. No wheezes, rales or rhonchi. Normal respiratory effort. Abdomen: Soft, obese, nondistended. No right upper quadrant or epigastric pain. No rebound or guarding all 4  quadrants. No left upper quadrant or right lower quadrant pain. Mild tenderness to palpation of the suprapubic area and low midline abdomen. However no guarding.  Data Reviewed: Basic Metabolic Panel:  Lab 06/13/11 9562 06/12/11 0600 06/11/11 1340  NA 125* 133* 133*  K 3.4* 3.7 --  CL 93* 101 95*  CO2 19 21 25   GLUCOSE 336* 291* 394*  BUN 15 12 8   CREATININE 1.51* 1.03 0.60  CALCIUM 8.1* 8.4 9.3  MG -- 1.2* --  PHOS -- 3.8 --   Liver Function Tests:  Lab 06/13/11 0630 06/12/11 0600 06/11/11 1340  AST 25 18 19   ALT 19 16 19   ALKPHOS 157* 153* 248*  BILITOT 0.9 1.7* 0.8  PROT 7.5 7.0 8.3  ALBUMIN 2.7* 3.0* 3.7    Lab 06/11/11 1340  LIPASE 20  AMYLASE --   CBC:  Lab 06/13/11 0630 06/12/11 0600 06/11/11 1340  WBC 7.7 10.9* 9.7  NEUTROABS -- -- 7.3  HGB 14.6 14.6 15.3  HCT 43.2 43.4 46.1  MCV 83.1 83.6 82.5  PLT 124* 141* 149*   Cardiac Enzymes:  Lab 06/11/11 1846  CKTOTAL 84  CKMB 1.8  CKMBINDEX --  TROPONINI <0.30   CBG:  Lab 06/13/11 1210 06/13/11 0802 06/13/11 0343 06/12/11 2332 06/12/11 2014  GLUCAP 277* 318* 338* 259* 366*    Studies: Dg Chest 2 View  06/11/2011  *RADIOLOGY REPORT*  Clinical Data: Abdominal pain for 2 days  CHEST - 2 VIEW  Comparison: CT abdomen pelvis 06/11/2011  Findings: There are changes of median sternotomy. The second and third median sternotomy wires are fractured. Lung volumes are low, with crowding of pulmonary vascularity.  Heart size is within normal limits. The lungs are clear.  There is no pleural effusion or  pneumothorax.  Visualized osseous structures are unremarkable.  IMPRESSION: Low lung volumes.  No acute findings.  Original Report Authenticated By: Britta Mccreedy, M.D.   Nm Hepatobiliary Liver Func  06/12/2011  *RADIOLOGY REPORT*  Clinical Data:  Concern for cholecystitis.  NUCLEAR MEDICINE HEPATOBILIARY IMAGING  Technique:  Sequential images of the abdomen were obtained out to 60 minutes following intravenous  administration of radiopharmaceutical.  Radiopharmaceutical:  4.5 mCi Tc-77m Choletec  Comparison:  Ultrasound 06/11/2011  Findings: There is prompt clearance of radiotracer from the blood pool and homogeneous uptake in the liver.  The gallbladder is evident by 30 minutes.  Counts evident in the bowel by 15 minutes.  IMPRESSION: No evidence of cholecystitis.  Patent cystic duct and common bile duct.  Original Report Authenticated By: Genevive Bi, M.D.   US Scrotum  06/12/2011  *RADIOLOGY REPORT*  Clinical Data:  Testicular pain  SCROTAL ULTRASOUND DOPPLER ULTRASOUND OF THE TESTICLES  Technique: Complete ultrasound examination of the testicles, epididymis, and other scrotal structures was performed.  Color and spectral Doppler ultrasound were also utilized to evaluate blood flow to the testicles.  Comparison:  None  Findings: Both testicles have a normal size and echotexture.  The right testicle measures 4.0 x 2.2 x 3.1 cm, the left testicle measures 4.1 x 2.0 x 2.7 cm.  There is no evidence of testicular mass or hydrocele.  Both epididymi have a normal appearance with incidental note of a 5 mm simple cyst on the left.  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  Pulsed Doppler interrogation of both testes demonstrates symmetric, low resistance flow bilaterally.  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  Original Report Authenticated By: Brandon Melnick, M.D.   Mr Liver W Wo Contrast  06/13/2011  *RADIOLOGY REPORT*  Clinical Data: Cirrhosis.  IVC calcification.  MRI ABDOMEN WITH AND WITHOUT CONTRAST  Technique:  Multiplanar multisequence MR imaging of the abdomen was performed both before and after administration of intravenous contrast.  Contrast: 20mL MULTIHANCE GADOBENATE DIMEGLUMINE 529 MG/ML IV SOLN  Comparison: CT scan 06/11/2011.  Findings: There are cirrhotic changes involving the liver with an irregular contour or and increased caudate to right  lobe ratio. There is of hepatic fibrosis are noted but no focal worrisome hepatic mass lesion.  No intrahepatic biliary dilatation.  The gallbladder is unremarkable.  No gallstones or inflammatory changes.  No common bile duct dilatation.  There are dense calcification involving the IVC as noted on the CT scan.  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  However, the hepatic veins are patent and although the IVC lumen is markedly narrowed the IVC is still patent. There are fairly extensive paraspinal collaterals and azygos venous prominence.  There are portal venous collaterals.  The portal and splenic veins are patent. There are enlarged periportal and celiac axis lymph nodes likely due to cirrhosis.  The pancreas is normal.  The spleen is normal in size.  No focal lesions.  Adrenal glands and kidneys are unremarkable.  A right renal cyst and scarring change are noted.  The aorta is normal in caliber.  The major branch vessels are patent.  IMPRESSION:  1.  Cirrhotic changes without worrisome hepatic lesions. 2.  Portal venous collaterals but no splenomegaly or ascites.  The portal and splenic veins are patent. 3.  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  The hepatic veins are patent.  Paraspinal and azygos collaterals are noted.  Original  Report Authenticated By: P. Loralie Champagne, M.D.   Scheduled Meds:    . acetaminophen  650 mg Oral Once  .  HYDROmorphone (DILAUDID) injection  0.5 mg Intravenous Once  . insulin aspart  0-9 Units Subcutaneous Q4H  . insulin glargine  5 Units Subcutaneous Daily  . magnesium oxide  400 mg Oral BID  . olmesartan  40 mg Oral Daily  . piperacillin-tazobactam  3.375 g Intravenous Q8H   Continuous Infusions:   . sodium chloride 75 mL/hr at 06/12/11 0404     Assessment/Plan: 1. Fever: Etiology unclear. Remains nontoxic. Blood cultures no growth today. Started empirically on Zosyn. However now has renal  failure. Not clear whether related however there is no clear source of infection. Will discontinue Zosyn.  2. Acute renal failure: Etiology and significance unclear at this point. Repeat a basic metabolic panel in the morning. Repeat urinalysis. Abdominal ultrasound November 30 revealed normal kidneys. Differential would include medication-induced (Zosyn, Benicar) or contrast-induced. If his kidney function worsens would consider nephrology consultation. 3. Abdominal pain: Etiology remains unclear. Patient does have diarrhea (C. difficile PCR negative) and some nausea. Abdominal ultrasound and HIDA scans were negative. CT of the abdomen and pelvis without contrast was unrevealing and as was MRI of the liver. Consider GI consultation given his diarrhea. Consider repeat CT scan with oral contrast. His abdominal exam however does remain benign. There is no evidence of an acute surgical issue. Cannot exclude diverticulitis at this point although his exam and presentation is not highly suggestive of this. We will place him on empiric Rocephin and Flagyl. 4. Hyponatremia: Possibly related to #2. Start normal saline. 5. Thrombocytopenia: Etiology unclear. Patient has not been on heparin. Repeat CBC in the morning. 6. New diagnosis of cirrhosis: Etiology unclear. Hepatitis serologies negative. Alcohol abuse denied. He will need gastroenterology evaluation. 7. Testicular pain: Presumably referred pain from the abdomen. Workup was unremarkable.. 8. 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 9. Isolated elevation of alkaline phosphatase: This was also seen 09/10/2010. Etiology significance unclear. 10. Diabetes mellitus, uncontrolled: Poor control. Metformin on hold while in hospital. Hold glipizide for now as his oral intake is limited. Increase Lantus. Continue sliding scale every 4 hours.  Complex case. Difficult to tie multiple acute issues  together. He remains nontoxic and evidence for bacterial infection remains lacking. His only localizing symptoms are his abdominal pain and diarrhea. Continue to follow blood cultures. Gastroenterology evaluation.  Code Status: Full  Disposition Plan: Pending further evaluation.   Brendia Sacks, MD  [facility] Pager [phone] 06/12/2011, 8:42 AM    LOS: 1 day

## 2011-06-13 LAB — TSH: TSH: 2.131 u[IU]/mL (ref 0.350–4.500)

## 2011-06-13 LAB — GLUCOSE, CAPILLARY
Glucose-Capillary: 338 mg/dL — ABNORMAL HIGH (ref 70–99)
Glucose-Capillary: 318 mg/dL — ABNORMAL HIGH (ref 70–99)
Glucose-Capillary: 277 mg/dL — ABNORMAL HIGH (ref 70–99)
Glucose-Capillary: 192 mg/dL — ABNORMAL HIGH (ref 70–99)
Glucose-Capillary: 168 mg/dL — ABNORMAL HIGH (ref 70–99)

## 2011-06-13 LAB — COMPREHENSIVE METABOLIC PANEL
AST: 25 U/L (ref 0–37)
Albumin: 2.7 g/dL — ABNORMAL LOW (ref 3.5–5.2)
BUN: 15 mg/dL (ref 6–23)
CO2: 19 mEq/L (ref 19–32)
Glucose, Bld: 336 mg/dL — ABNORMAL HIGH (ref 70–99)
Potassium: 3.4 mEq/L — ABNORMAL LOW (ref 3.5–5.1)
Sodium: 125 mEq/L — ABNORMAL LOW (ref 135–145)
Total Bilirubin: 0.9 mg/dL (ref 0.3–1.2)

## 2011-06-13 LAB — CBC
HCT: 43.2 % (ref 39.0–52.0)
Hemoglobin: 14.6 g/dL (ref 13.0–17.0)
MCH: 28.1 pg (ref 26.0–34.0)
RBC: 5.2 MIL/uL (ref 4.22–5.81)

## 2011-06-13 LAB — CLOSTRIDIUM DIFFICILE BY PCR: Toxigenic C. Difficile by PCR: NEGATIVE

## 2011-06-13 MED ORDER — OXYCODONE HCL 5 MG PO TABS
5.0000 mg | ORAL_TABLET | ORAL | Status: DC | PRN
  Administered 2011-06-14: 5 mg via ORAL
  Filled 2011-06-13: qty 1

## 2011-06-13 MED ORDER — SODIUM CHLORIDE 0.9 % IV SOLN
INTRAVENOUS | Status: DC
  Administered 2011-06-14 – 2011-06-15 (×5): via INTRAVENOUS

## 2011-06-13 MED ORDER — METRONIDAZOLE IN NACL 5-0.79 MG/ML-% IV SOLN
500.0000 mg | Freq: Four times a day (QID) | INTRAVENOUS | Status: DC
  Administered 2011-06-13 – 2011-06-15 (×8): 500 mg via INTRAVENOUS
  Filled 2011-06-13 (×12): qty 100

## 2011-06-13 MED ORDER — INSULIN GLARGINE 100 UNIT/ML ~~LOC~~ SOLN
10.0000 [IU] | Freq: Every day | SUBCUTANEOUS | Status: DC
  Administered 2011-06-14: 10 [IU] via SUBCUTANEOUS
  Filled 2011-06-13: qty 3

## 2011-06-13 MED ORDER — IBUPROFEN 600 MG PO TABS
600.0000 mg | ORAL_TABLET | Freq: Four times a day (QID) | ORAL | Status: DC | PRN
  Filled 2011-06-13: qty 1

## 2011-06-13 MED ORDER — HYDROMORPHONE HCL PF 1 MG/ML IJ SOLN
0.5000 mg | Freq: Once | INTRAMUSCULAR | Status: AC
  Administered 2011-06-13: 0.5 mg via INTRAVENOUS

## 2011-06-13 MED ORDER — ACETAMINOPHEN 325 MG PO TABS: 650.0000 mg | ORAL_TABLET | Freq: Four times a day (QID) | ORAL | Status: DC | PRN

## 2011-06-13 MED ORDER — DEXTROSE 5 % IV SOLN
1.0000 g | INTRAVENOUS | Status: DC
  Administered 2011-06-13 – 2011-06-14 (×2): 1 g via INTRAVENOUS
  Filled 2011-06-13 (×3): qty 10

## 2011-06-14 LAB — COMPREHENSIVE METABOLIC PANEL
ALT: 21 U/L (ref 0–53)
Albumin: 2.6 g/dL — ABNORMAL LOW (ref 3.5–5.2)
Alkaline Phosphatase: 163 U/L — ABNORMAL HIGH (ref 39–117)
Calcium: 8.1 mg/dL — ABNORMAL LOW (ref 8.4–10.5)
GFR calc Af Amer: >90 mL/min (ref >90)
Potassium: 3.7 mEq/L (ref 3.5–5.1)
Sodium: 130 mEq/L — ABNORMAL LOW (ref 135–145)
Total Protein: 6.9 g/dL (ref 6.0–8.3)

## 2011-06-14 LAB — GLUCOSE, CAPILLARY
Glucose-Capillary: 218 mg/dL — ABNORMAL HIGH (ref 70–99)
Glucose-Capillary: 222 mg/dL — ABNORMAL HIGH (ref 70–99)
Glucose-Capillary: 218 mg/dL — ABNORMAL HIGH (ref 70–99)

## 2011-06-14 LAB — URINE MICROSCOPIC-ADD ON

## 2011-06-14 LAB — URINALYSIS, ROUTINE W REFLEX MICROSCOPIC
Bilirubin Urine: NEGATIVE
Nitrite: NEGATIVE
Specific Gravity, Urine: 1.012 (ref 1.005–1.030)
Urobilinogen, UA: 0.2 mg/dL (ref 0.0–1.0)
pH: 5.5 (ref 5.0–8.0)

## 2011-06-14 LAB — CBC
MCH: 27.3 pg (ref 26.0–34.0)
MCHC: 33.1 g/dL (ref 30.0–36.0)
Platelets: 138 10*3/uL — ABNORMAL LOW (ref 150–400)
RDW: 13.1 % (ref 11.5–15.5)

## 2011-06-14 LAB — IRON AND TIBC
Saturation Ratios: 14 % — ABNORMAL LOW (ref 20–55)
UIBC: 188 ug/dL (ref 125–400)

## 2011-06-14 LAB — CREATININE, URINE, RANDOM: Creatinine, Urine: 98.88 mg/dL

## 2011-06-14 LAB — FERRITIN: Ferritin: 433 ng/mL — ABNORMAL HIGH (ref 22–322)

## 2011-06-14 LAB — SODIUM, URINE, RANDOM: Sodium, Ur: <10 mEq/L

## 2011-06-14 MED ORDER — INSULIN GLARGINE 100 UNIT/ML ~~LOC~~ SOLN
15.0000 [IU] | Freq: Every day | SUBCUTANEOUS | Status: DC
  Administered 2011-06-15: 15 [IU] via SUBCUTANEOUS
  Filled 2011-06-14 (×3): qty 3

## 2011-06-14 NOTE — Consult Note (Signed)
[facility] Consult Note  Referring Provider: No ref. provider found Primary Care Physician:  No primary provider on file. Primary Gastroenterologist:  Dr.  Antony Contras Complaint: Fever and lower abdominal pain HPI: Marcus Vance is an 45 y.o. black male.  He was admitted on 11:30 with diffuse lower abdominal pain fevers to 102. For details please see admission history and physical. He experiences intermittent bilateral lower quadrant abdominal pain with some radiation to the testicles. During his evaluation he had an abdominal ultrasound which showed a markedly thickened gallbladder. CT scan likewise showed a thickened gallbladder and a nodular liver suggestive of cirrhosis as well as some mild adenopathy in the mesentery. MRI was done as well as Dopplers which confirmed impression of cirrhosis with normal patent hepatic and portal veins. Interestingly the MRI said the gallbladder appeared normal. Hepatitis scan was done which was normal. The patient denies any pain in the epigastric or right upper quadrant location. He is had an elevated alkaline phosphatase but his other liver function tests have been normal.  He denies any personal or family history of cirrhosis jaundice or hepatitis. He denies significant alcohol use any IV drug use tattoos blood transfusion or suspicious medication or supplement history. He has been somewhat obese most of his life.  Past Medical History  Diagnosis Date  . Hypertension   . Diabetes mellitus   . Coronary artery disease   . Hiatal hernia   . Sleep apnea   . GERD (gastroesophageal reflux disease)   . Headache     Past Surgical History  Procedure Date  . Coronary artery bypass graft   . Appendectomy     Medications Prior to Admission  Medication Dose Route Frequency Provider Last Rate Last Dose  . 0.9 %  sodium chloride infusion   Intravenous Continuous Anastassia Doutova 75 mL/hr at 06/12/11 0404    . 0.9 %  sodium chloride infusion    Intravenous Continuous Pandora Leiter 150 mL/hr at 06/14/11 4540    . acetaminophen (TYLENOL) tablet 650 mg  650 mg Oral Q6H PRN Pandora Leiter      . albuterol (PROVENTIL) (5 MG/ML) 0.5% nebulizer solution 2.5 mg  2.5 mg Nebulization Q2H PRN Anastassia Doutova      . alum & mag hydroxide-simeth (MAALOX/MYLANTA) 200-200-20 MG/5ML suspension 30 mL  30 mL Oral Q6H PRN Anastassia Doutova      . cefTRIAXone (ROCEPHIN) 1 g in dextrose 5 % 50 mL IVPB  1 g Intravenous Q24H Pandora Leiter   1 g at 06/13/11 2018  . gadobenate dimeglumine (MULTIHANCE) injection 20 mL  20 mL Intravenous Once PRN Medication Radiologist   20 mL at 06/12/11 1526  . guaiFENesin-dextromethorphan (ROBITUSSIN DM) 100-10 MG/5ML syrup 5 mL  5 mL Oral Q4H PRN Anastassia Doutova      . HYDROmorphone (DILAUDID) 1 MG/ML injection           . HYDROmorphone (DILAUDID) injection 0.5 mg  0.5 mg Intravenous Once Rolan Lipa   0.5 mg at 06/13/11 0231  . HYDROmorphone (DILAUDID) injection 1 mg  1 mg Intravenous Once Hilario Quarry, MD   1 mg at 06/11/11 1452  . HYDROmorphone (DILAUDID) injection 1 mg  1 mg Intravenous Once Peter A. Patrica Duel, MD   1 mg at 06/11/11 1955  . HYDROmorphone (DILAUDID) injection 1 mg  1 mg Intravenous Q4H PRN Anastassia Doutova   1 mg at 06/14/11 0559  . ibuprofen (ADVIL,MOTRIN) tablet 800 mg  800 mg Oral Once [employer]  Denny Levy, MD   800 mg at 06/11/11 1519  . ibuprofen (ADVIL,MOTRIN) tablet 800 mg  800 mg Oral Once Peter A. Patrica Duel, MD   800 mg at 06/11/11 2129  . insulin aspart (novoLOG) injection 0-9 Units  0-9 Units Subcutaneous Q4H Anastassia Doutova   3 Units at 06/14/11 1226  . insulin glargine (LANTUS) injection 15 Units  15 Units Subcutaneous Daily Pandora Leiter      . metroNIDAZOLE (FLAGYL) IVPB 500 mg  500 mg Intravenous Q6H Reina Fuse Goodrich   500 mg at 06/14/11 1610  . ondansetron (ZOFRAN) injection 4 mg  4 mg Intravenous Once Hilario Quarry, MD   4 mg at 06/11/11 1452  .  ondansetron (ZOFRAN) tablet 4 mg  4 mg Oral Q6H PRN Anastassia Doutova       Or  . ondansetron (ZOFRAN) injection 4 mg  4 mg Intravenous Q6H PRN Anastassia Doutova      . oxyCODONE (Oxy IR/ROXICODONE) immediate release tablet 5 mg  5 mg Oral Q4H PRN Pandora Leiter   5 mg at 06/14/11 1048  . sodium chloride 0.9 % bolus 1,000 mL  1,000 mL Intravenous Once Hilario Quarry, MD   1,000 mL at 06/11/11 1449  . technetium TC 93M mebrofenin (CHOLETEC) injection 5 milli Curie  5 milli Curie Intravenous Once PRN Medication Radiologist   5 milli Curie at 06/12/11 1155  . DISCONTD: acetaminophen (TYLENOL) suppository 650 mg  650 mg Rectal Q6H PRN Anastassia Doutova      . DISCONTD: acetaminophen (TYLENOL) tablet 650 mg  650 mg Oral Once Hilario Quarry, MD      . DISCONTD: acetaminophen (TYLENOL) tablet 650 mg  650 mg Oral Once Hilario Quarry, MD      . DISCONTD: acetaminophen (TYLENOL) tablet 650 mg  650 mg Oral Q6H PRN Anastassia Doutova   650 mg at 06/12/11 1650  . DISCONTD: ibuprofen (ADVIL,MOTRIN) tablet 600 mg  600 mg Oral Q6H PRN Pandora Leiter      . DISCONTD: insulin glargine (LANTUS) injection 10 Units  10 Units Subcutaneous Daily Pandora Leiter   10 Units at 06/14/11 1019  . DISCONTD: insulin glargine (LANTUS) injection 5 Units  5 Units Subcutaneous Daily Pandora Leiter   5 Units at 06/13/11 0900  . DISCONTD: magnesium oxide (MAG-OX) tablet 400 mg  400 mg Oral BID Reina Fuse Goodrich   400 mg at 06/14/11 1019  . DISCONTD: morphine 2 MG/ML injection 2 mg  2 mg Intravenous Q4H PRN Anastassia Doutova   2 mg at 06/12/11 0407  . DISCONTD: olmesartan (BENICAR) tablet 40 mg  40 mg Oral Daily Anastassia Doutova   40 mg at 06/13/11 1038  . DISCONTD: piperacillin-tazobactam (ZOSYN) IVPB 3.375 g  3.375 g Intravenous Q8H Anastassia Doutova   3.375 g at 06/13/11 1320  . DISCONTD: sodium chloride 0.9 % bolus 500 mL  500 mL Intravenous Once Peter A. Patrica Duel, MD       Medications Prior to  Admission  Medication Sig Dispense Refill  . metFORMIN (GLUCOPHAGE) 500 MG tablet Take 500 mg by mouth 2 (two) times daily with a meal.          Allergies: No Known Allergies  Family History  Problem Relation Age of Onset  . Hypertension Mother   . Diabetes Mother   . COPD Father     Social History:  reports that he has been smoking.  He does not have any smokeless tobacco history on  file. He reports that he drinks about 1.8 ounces of alcohol per week. He reports that he does not use illicit drugs.  Negative except as above. Press F2   Blood pressure 116/85, pulse 91, temperature 97.9 F (36.6 C), temperature source Oral, resp. rate 17, height 6' (1.829 m), weight 137.7 kg (303 lb 9.2 oz), SpO2 93.00%. Head: Normocephalic, without obvious abnormality, atraumatic Neck: no adenopathy, no carotid bruit, no JVD, supple, symmetrical, trachea midline and thyroid not enlarged, symmetric, no tenderness/mass/nodules Resp: clear to auscultation bilaterally Cardio: regular rate and rhythm, S1, S2 normal, no murmur, click, rub or gallop GI: Abdomen soft moderately distended with normoactive bowel sounds no hepatomegaly masses or guarding. There is mild diffuse tenderness in lower abdomen. Extremities: extremities normal, atraumatic, no cyanosis or edema  Results for orders placed during the hospital encounter of 06/11/11 (from the past 48 hour(s))  GLUCOSE, CAPILLARY     Status: Abnormal   Collection Time   06/12/11  4:20 PM      Component Value Range Comment   Glucose-Capillary 285 (*) 70 - 99 (mg/dL)    Comment 1 Notify RN     GLUCOSE, CAPILLARY     Status: Abnormal   Collection Time   06/12/11  8:14 PM      Component Value Range Comment   Glucose-Capillary 366 (*) 70 - 99 (mg/dL)   GLUCOSE, CAPILLARY     Status: Abnormal   Collection Time   06/12/11 11:32 PM      Component Value Range Comment   Glucose-Capillary 259 (*) 70 - 99 (mg/dL)   CLOSTRIDIUM DIFFICILE BY PCR     Status:  Normal   Collection Time   06/13/11  3:32 AM      Component Value Range Comment   C difficile by pcr NEGATIVE  NEGATIVE    GLUCOSE, CAPILLARY     Status: Abnormal   Collection Time   06/13/11  3:43 AM      Component Value Range Comment   Glucose-Capillary 338 (*) 70 - 99 (mg/dL)   CBC     Status: Abnormal   Collection Time   06/13/11  6:30 AM      Component Value Range Comment   WBC 7.7  4.0 - 10.5 (K/uL)    RBC 5.20  4.22 - 5.81 (MIL/uL)    Hemoglobin 14.6  13.0 - 17.0 (g/dL)    HCT 16.1  09.6 - 04.5 (%)    MCV 83.1  78.0 - 100.0 (fL)    MCH 28.1  26.0 - 34.0 (pg)    MCHC 33.8  30.0 - 36.0 (g/dL)    RDW 40.9  81.1 - 91.4 (%)    Platelets 124 (*) 150 - 400 (K/uL)   COMPREHENSIVE METABOLIC PANEL     Status: Abnormal   Collection Time   06/13/11  6:30 AM      Component Value Range Comment   Sodium 125 (*) 135 - 145 (mEq/L)    Potassium 3.4 (*) 3.5 - 5.1 (mEq/L)    Chloride 93 (*) 96 - 112 (mEq/L)    CO2 19  19 - 32 (mEq/L)    Glucose, Bld 336 (*) 70 - 99 (mg/dL)    BUN 15  6 - 23 (mg/dL)    Creatinine, Ser 7.82 (*) 0.50 - 1.35 (mg/dL)    Calcium 8.1 (*) 8.4 - 10.5 (mg/dL)    Total Protein 7.5  6.0 - 8.3 (g/dL)    Albumin 2.7 (*) 3.5 - 5.2 (g/dL)  AST 25  0 - 37 (U/L)    ALT 19  0 - 53 (U/L)    Alkaline Phosphatase 157 (*) 39 - 117 (U/L)    Total Bilirubin 0.9  0.3 - 1.2 (mg/dL)    GFR calc non Af Amer 55 (*) >90 (mL/min)    GFR calc Af Amer 64 (*) >90 (mL/min)   GLUCOSE, CAPILLARY     Status: Abnormal   Collection Time   06/13/11  8:02 AM      Component Value Range Comment   Glucose-Capillary 318 (*) 70 - 99 (mg/dL)   GLUCOSE, CAPILLARY     Status: Abnormal   Collection Time   06/13/11 12:10 PM      Component Value Range Comment   Glucose-Capillary 277 (*) 70 - 99 (mg/dL)   GLUCOSE, CAPILLARY     Status: Abnormal   Collection Time   06/13/11  4:56 PM      Component Value Range Comment   Glucose-Capillary 192 (*) 70 - 99 (mg/dL)   GLUCOSE, CAPILLARY     Status:  Abnormal   Collection Time   06/13/11  8:04 PM      Component Value Range Comment   Glucose-Capillary 168 (*) 70 - 99 (mg/dL)   GLUCOSE, CAPILLARY     Status: Abnormal   Collection Time   06/13/11 11:34 PM      Component Value Range Comment   Glucose-Capillary 195 (*) 70 - 99 (mg/dL)   GLUCOSE, CAPILLARY     Status: Abnormal   Collection Time   06/14/11  4:11 AM      Component Value Range Comment   Glucose-Capillary 218 (*) 70 - 99 (mg/dL)   CBC     Status: Abnormal   Collection Time   06/14/11  6:00 AM      Component Value Range Comment   WBC 6.5  4.0 - 10.5 (K/uL)    RBC 5.17  4.22 - 5.81 (MIL/uL)    Hemoglobin 14.1  13.0 - 17.0 (g/dL)    HCT 16.1  09.6 - 04.5 (%)    MCV 82.4  78.0 - 100.0 (fL)    MCH 27.3  26.0 - 34.0 (pg)    MCHC 33.1  30.0 - 36.0 (g/dL)    RDW 40.9  81.1 - 91.4 (%)    Platelets 138 (*) 150 - 400 (K/uL)   COMPREHENSIVE METABOLIC PANEL     Status: Abnormal   Collection Time   06/14/11  6:00 AM      Component Value Range Comment   Sodium 130 (*) 135 - 145 (mEq/L)    Potassium 3.7  3.5 - 5.1 (mEq/L)    Chloride 97  96 - 112 (mEq/L)    CO2 20  19 - 32 (mEq/L)    Glucose, Bld 223 (*) 70 - 99 (mg/dL)    BUN 15  6 - 23 (mg/dL)    Creatinine, Ser 7.82  0.50 - 1.35 (mg/dL)    Calcium 8.1 (*) 8.4 - 10.5 (mg/dL)    Total Protein 6.9  6.0 - 8.3 (g/dL)    Albumin 2.6 (*) 3.5 - 5.2 (g/dL)    AST 33  0 - 37 (U/L) SLIGHT HEMOLYSIS   ALT 21  0 - 53 (U/L)    Alkaline Phosphatase 163 (*) 39 - 117 (U/L)    Total Bilirubin 0.6  0.3 - 1.2 (mg/dL)    GFR calc non Af Amer 85 (*) >90 (mL/min)    GFR calc Af Amer >  90  >90 (mL/min)   URINALYSIS, ROUTINE W REFLEX MICROSCOPIC     Status: Abnormal   Collection Time   06/14/11  6:01 AM      Component Value Range Comment   Color, Urine YELLOW  YELLOW     APPearance CLEAR  CLEAR     Specific Gravity, Urine 1.012  1.005 - 1.030     pH 5.5  5.0 - 8.0     Glucose, UA 100 (*) NEGATIVE (mg/dL)    Hgb urine dipstick TRACE (*)  NEGATIVE     Bilirubin Urine NEGATIVE  NEGATIVE     Ketones, ur NEGATIVE  NEGATIVE (mg/dL)    Protein, ur 30 (*) NEGATIVE (mg/dL)    Urobilinogen, UA 0.2  0.0 - 1.0 (mg/dL)    Nitrite NEGATIVE  NEGATIVE     Leukocytes, UA NEGATIVE  NEGATIVE    SODIUM, URINE, RANDOM     Status: Normal   Collection Time   06/14/11  6:01 AM      Component Value Range Comment   Sodium, Ur <10     CREATININE, URINE, RANDOM     Status: Normal   Collection Time   06/14/11  6:01 AM      Component Value Range Comment   Creatinine, Urine 98.88     URINE MICROSCOPIC-ADD ON     Status: Normal   Collection Time   06/14/11  6:01 AM      Component Value Range Comment   Squamous Epithelial / LPF RARE  RARE     WBC, UA 0-2  <3 (WBC/hpf)    RBC / HPF 0-2  <3 (RBC/hpf)    Bacteria, UA RARE  RARE    GLUCOSE, CAPILLARY     Status: Abnormal   Collection Time   06/14/11  7:59 AM      Component Value Range Comment   Glucose-Capillary 222 (*) 70 - 99 (mg/dL)    Comment 1 Notify RN     GLUCOSE, CAPILLARY     Status: Abnormal   Collection Time   06/14/11  1:45 PM      Component Value Range Comment   Glucose-Capillary 244 (*) 70 - 99 (mg/dL)    Mr Liver W Wo Contrast  06/13/2011  *RADIOLOGY REPORT*  Clinical Data: Cirrhosis.  IVC calcification.  MRI ABDOMEN WITH AND WITHOUT CONTRAST  Technique:  Multiplanar multisequence MR imaging of the abdomen was performed both before and after administration of intravenous contrast.  Contrast: 20mL MULTIHANCE GADOBENATE DIMEGLUMINE 529 MG/ML IV SOLN  Comparison: CT scan 06/11/2011.  Findings: There are cirrhotic changes involving the liver with an irregular contour or and increased caudate to right lobe ratio. There is of hepatic fibrosis are noted but no focal worrisome hepatic mass lesion.  No intrahepatic biliary dilatation.  The gallbladder is unremarkable.  No gallstones or inflammatory changes.  No common bile duct dilatation.  There are dense calcification involving the IVC as noted  on the CT scan.  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  However, the hepatic veins are patent and although the IVC lumen is markedly narrowed the IVC is still patent. There are fairly extensive paraspinal collaterals and azygos venous prominence.  There are portal venous collaterals.  The portal and splenic veins are patent. There are enlarged periportal and celiac axis lymph nodes likely due to cirrhosis.  The pancreas is normal.  The spleen is normal in size.  No focal lesions.  Adrenal glands and kidneys are unremarkable.  A right  renal cyst and scarring change are noted.  The aorta is normal in caliber.  The major branch vessels are patent.  IMPRESSION:  1.  Cirrhotic changes without worrisome hepatic lesions. 2.  Portal venous collaterals but no splenomegaly or ascites.  The portal and splenic veins are patent. 3.  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  The hepatic veins are patent.  Paraspinal and azygos collaterals are noted.  Original Report Authenticated By: P. Loralie Champagne, M.D.    Assessment: Lower abdominal pain without definitive cause with multiple imaging studies suggesting cirrhosis of the liver with some imaging studies showing a thickened gallbladder without stones. Also some enlarged collateral portal vessels and IVC narrowing but no evidence of hepatic or portal vein thrombosis. Overall he does not seem to have been hepatobiliary sources any of his presenting symptoms or fever but he does have evidence of cirrhosis of the liver with normal transaminases. This is somewhat confusing picture. He is being treated with IV antibiotics with improvement in his temperature curve but not much improvement in his pain. Plan:  1. While continuing the following will obtain further workup including stool Hemoccults, serologic workup for identifiable causes of liver disease. 2. Allow him to eat and do not see an indication for surgical  consult specifically for gallbladder disease at this point although if symptoms persist may need liver biopsy and or sampling of enlarged mesenteric lymph nodes. Cj Beecher C 06/14/2011, 2:35 PM

## 2011-06-14 NOTE — Progress Notes (Signed)
PROGRESS NOTE  Whit Bruni JXB:[id number] DOB: January 16, 1966 DOA: 06/11/2011 PCP: [facility]  Brief narrative: 45 year old man presents the emergency department with 2 days of lower count and low back pain radiating into his testicle, nausea, diarrhea, fever. Emergency department impression was: abdominal pain, cirrhosis, fever 102.3. The patient was transferred to [facility] for further evaluation. Exam was notable for slight left lower quadrant tenderness. No right upper quadrant tenderness. CT of the abdomen and pelvis was concerning for cirrhosis, edema surrounding the gallbladder, large calcification near the junction of the inferior vena cava and right atrium, prominent lymph nodes in the porta hepatis and surrounding the pancreas, prominent lymph nodes right lower quadrant of the abdomen.  Past medical history: Hypertension, diabetes mellitus, hiatal hernia, obstructive sleep apnea, GERD  Consultants:  Gastroenterology  Procedures:  None  Interim History: Interim documentation reviewed. Patient like diet advanced. Subjective: Overall he feels a bit better. Diarrhea has slowed down but continues. No nausea. He would like his diet meds. Abdominal pain continues.  Objective: Filed Vitals:   06/13/11 0630 06/13/11 1700 06/13/11 2200 06/14/11 0528  BP: 98/71 150/83 117/77 114/77  Pulse: 111 102 99 96  Temp: 98.7 F (37.1 C) 99.6 F (37.6 C) 98.7 F (37.1 C) 98.4 F (36.9 C)  TempSrc: Oral Oral Oral Oral  Resp: 16 17 16 18   Height:      Weight:      SpO2: 93% 96% 97% 97%     Intake/Output Summary (Last 24 hours) at 06/14/11 1040 Last data filed at 06/13/11 1700  Gross per 24 hour  Intake    650 ml  Output      2 ml  Net    648 ml    Exam: General: Calm. He appears less uncomfortable today. Lying flat in bed. Cardiovascular: Tachycardic, regular rhythm. No murmur, rub or gallop.  Respiratory: Clear to auscultation bilaterally. No wheezes,  rales or rhonchi. Normal respiratory effort. Abdomen: Soft, obese, nondistended. No right upper quadrant or epigastric pain. No rebound or guarding. No left upper quadrant or right lower quadrant pain. Mild tenderness to palpation of the suprapubic area and low midline abdomen. However no guarding.  Data Reviewed: Basic Metabolic Panel:  Lab 06/14/11 1308 06/13/11 0630 06/12/11 0600 06/11/11 1340  NA 130* 125* 133* 133*  K 3.7 3.4* -- --  CL 97 93* 101 95*  CO2 20 19 21 25   GLUCOSE 223* 336* 291* 394*  BUN 15 15 12 8   CREATININE 1.05 1.51* 1.03 0.60  CALCIUM 8.1* 8.1* 8.4 9.3  MG -- -- 1.2* --  PHOS -- -- 3.8 --   Liver Function Tests:  Lab 06/14/11 0600 06/13/11 0630 06/12/11 0600 06/11/11 1340  AST 33 25 18 19   ALT 21 19 16 19   ALKPHOS 163* 157* 153* 248*  BILITOT 0.6 0.9 1.7* 0.8  PROT 6.9 7.5 7.0 8.3  ALBUMIN 2.6* 2.7* 3.0* 3.7    Lab 06/11/11 1340  LIPASE 20  AMYLASE --   CBC:  Lab 06/14/11 0600 06/13/11 0630 06/12/11 0600 06/11/11 1340  WBC 6.5 7.7 10.9* 9.7  NEUTROABS -- -- -- 7.3  HGB 14.1 14.6 14.6 15.3  HCT 42.6 43.2 43.4 46.1  MCV 82.4 83.1 83.6 82.5  PLT 138* 124* 141* 149*   Cardiac Enzymes:  Lab 06/11/11 1846  CKTOTAL 84  CKMB 1.8  CKMBINDEX --  TROPONINI <0.30   CBG:  Lab 06/14/11 0759 06/14/11 0411 06/13/11 2334 06/13/11 2004 06/13/11 1656  GLUCAP  222* 218* 195* 168* 192*    Studies: Dg Chest 2 View  06/11/2011  *RADIOLOGY REPORT*  Clinical Data: Abdominal pain for 2 days  CHEST - 2 VIEW  Comparison: CT abdomen pelvis 06/11/2011  Findings: There are changes of median sternotomy. The second and third median sternotomy wires are fractured. Lung volumes are low, with crowding of pulmonary vascularity.  Heart size is within normal limits. The lungs are clear.  There is no pleural effusion or pneumothorax.  Visualized osseous structures are unremarkable.  IMPRESSION: Low lung volumes.  No acute findings.  Original Report Authenticated By: Britta Mccreedy, M.D.   Nm Hepatobiliary Liver Func  06/12/2011  *RADIOLOGY REPORT*  Clinical Data:  Concern for cholecystitis.  NUCLEAR MEDICINE HEPATOBILIARY IMAGING  Technique:  Sequential images of the abdomen were obtained out to 60 minutes following intravenous administration of radiopharmaceutical.  Radiopharmaceutical:  4.5 mCi Tc-36m Choletec  Comparison:  Ultrasound 06/11/2011  Findings: There is prompt clearance of radiotracer from the blood pool and homogeneous uptake in the liver.  The gallbladder is evident by 30 minutes.  Counts evident in the bowel by 15 minutes.  IMPRESSION: No evidence of cholecystitis.  Patent cystic duct and common bile duct.  Original Report Authenticated By: Genevive Bi, M.D.   US Scrotum  06/12/2011  *RADIOLOGY REPORT*  Clinical Data:  Testicular pain  SCROTAL ULTRASOUND DOPPLER ULTRASOUND OF THE TESTICLES  Technique: Complete ultrasound examination of the testicles, epididymis, and other scrotal structures was performed.  Color and spectral Doppler ultrasound were also utilized to evaluate blood flow to the testicles.  Comparison:  None  Findings: Both testicles have a normal size and echotexture.  The right testicle measures 4.0 x 2.2 x 3.1 cm, the left testicle measures 4.1 x 2.0 x 2.7 cm.  There is no evidence of testicular mass or hydrocele.  Both epididymi have a normal appearance with incidental note of a 5 mm simple cyst on the left.  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  Pulsed Doppler interrogation of both testes demonstrates symmetric, low resistance flow bilaterally.  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  Original Report Authenticated By: Brandon Melnick, M.D.   Mr Liver W Wo Contrast  06/13/2011  *RADIOLOGY REPORT*  Clinical Data: Cirrhosis.  IVC calcification.  MRI ABDOMEN WITH AND WITHOUT CONTRAST  Technique:  Multiplanar multisequence MR imaging of the abdomen was performed both before and  after administration of intravenous contrast.  Contrast: 20mL MULTIHANCE GADOBENATE DIMEGLUMINE 529 MG/ML IV SOLN  Comparison: CT scan 06/11/2011.  Findings: There are cirrhotic changes involving the liver with an irregular contour or and increased caudate to right lobe ratio. There is of hepatic fibrosis are noted but no focal worrisome hepatic mass lesion.  No intrahepatic biliary dilatation.  The gallbladder is unremarkable.  No gallstones or inflammatory changes.  No common bile duct dilatation.  There are dense calcification involving the IVC as noted on the CT scan.  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  However, the hepatic veins are patent and although the IVC lumen is markedly narrowed the IVC is still patent. There are fairly extensive paraspinal collaterals and azygos venous prominence.  There are portal venous collaterals.  The portal and splenic veins are patent. There are enlarged periportal and celiac axis lymph nodes likely due to cirrhosis.  The pancreas is normal.  The spleen is normal in size.  No focal lesions.  Adrenal glands and kidneys are unremarkable.  A  right renal cyst and scarring change are noted.  The aorta is normal in caliber.  The major branch vessels are patent.  IMPRESSION:  1.  Cirrhotic changes without worrisome hepatic lesions. 2.  Portal venous collaterals but no splenomegaly or ascites.  The portal and splenic veins are patent. 3.  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  The hepatic veins are patent.  Paraspinal and azygos collaterals are noted.  Original Report Authenticated By: P. Loralie Champagne, M.D.   Scheduled Meds:    . cefTRIAXone (ROCEPHIN)  IV  1 g Intravenous Q24H  . insulin aspart  0-9 Units Subcutaneous Q4H  . insulin glargine  10 Units Subcutaneous Daily  . magnesium oxide  400 mg Oral BID  . metronidazole  500 mg Intravenous Q6H  . DISCONTD: acetaminophen  650 mg Oral Once  . DISCONTD:  insulin glargine  5 Units Subcutaneous Daily  . DISCONTD: olmesartan  40 mg Oral Daily  . DISCONTD: piperacillin-tazobactam  3.375 g Intravenous Q8H   Continuous Infusions:    . sodium chloride 150 mL/hr at 06/14/11 0811     Assessment/Plan: 1. Fever: Etiology unclear, now resolved. Remains nontoxic. Blood cultures no growth today. Only localizing symptom is diarrhea and midline/low, pain. Treatment as below. 2. Acute renal failure: Resolved. ARB on hold. 3. Abdominal pain: Etiology remains unclear. Patient does have diarrhea (C. difficile PCR negative). Abdominal ultrasound and HIDA scans were negative. CT of the abdomen and pelvis without contrast was unrevealing and as was MRI of the liver. Will repeat CT scan with oral contrast to assess the bowel tomorrow given his recent renal insufficiency. His abdominal exam however does remain benign. There is no evidence of an acute surgical issue. Cannot exclude diverticulitis at this point although his exam and presentation is not highly suggestive of this. Continue on empiric Rocephin and Flagyl for now. Gastroenterology consultation. 4. Hyponatremia: Resolving. Continue to follow. 5. Thrombocytopenia: Etiology unclear. Patient has not been on heparin. Stabilizing. Continue to follow CBC.  6. New diagnosis of cirrhosis: Etiology unclear. Hepatitis serologies negative. Alcohol abuse denied. 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 7. Testicular pain: Resolved. 8. 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 9. Isolated elevation of alkaline phosphatase: This was also seen 09/10/2010. Etiology significance unclear. 10. Diabetes mellitus, uncontrolled: Poor control. Metformin on hold while in hospital. Hold glipizide for now as his oral intake is limited. Increase Lantus. Continue sliding scale every 4 hours.  Code Status: Full  Disposition Plan: Pending further  evaluation.   Brendia Sacks, MD  [facility] Pager [phone] 06/14/2011, 10:40 AM    LOS: 3 days

## 2011-06-15 LAB — BASIC METABOLIC PANEL
BUN: 9 mg/dL (ref 6–23)
GFR calc Af Amer: >90 mL/min (ref >90)
GFR calc non Af Amer: >90 mL/min (ref >90)
Potassium: 3.5 mEq/L (ref 3.5–5.1)
Sodium: 137 mEq/L (ref 135–145)

## 2011-06-15 LAB — CBC
HCT: 40.5 % (ref 39.0–52.0)
MCHC: 34.3 g/dL (ref 30.0–36.0)
Platelets: 167 10*3/uL (ref 150–400)
RDW: 13 % (ref 11.5–15.5)

## 2011-06-15 LAB — MITOCHONDRIAL ANTIBODIES: Mitochondrial M2 Ab, IgG: 0.1 (ref <0.91)

## 2011-06-15 LAB — GLUCOSE, CAPILLARY: Glucose-Capillary: 218 mg/dL — ABNORMAL HIGH (ref 70–99)

## 2011-06-15 NOTE — Plan of Care (Signed)
Problem: Consults Goal: Diabetes Guidelines if Diabetic/Glucose > 140 If diabetic or lab glucose is > 140 mg/dl - Initiate Diabetes/Hyperglycemia Guidelines & Document Interventions  Outcome: Progressing Have requested MD to increase hospital Lantus dose and initiate for home medication due to elevated HgBA1C of 12.4%.  Pt may need education.  Will order in-pt education per hospital TV network, [place] exit care notes, and may need insulin instruction for home therapy.

## 2011-06-15 NOTE — Progress Notes (Signed)
Inpatient Diabetes Program Recommendations  AACE/ADA: New Consensus Statement on Inpatient Glycemic Control (2009)  Target Ranges:  Prepandial:   less than 140 mg/dL      Peak postprandial:   less than 180 mg/dL (1-2 hours)      Critically ill patients:  140 - 180 mg/dL   Reason for Visit: Glucose greater than 200 mg/dL while using q 4hr correction.  Inpatient Diabetes Program Recommendations Insulin - Basal: Based on minimal starting Lantus dose of 0.2 units/kg, pt would need 28 units of basal Lantus.  recommmend icnrease in Lantus presently to 25 units.   Correction (SSI): Pt has been getting 3 units q 4 hrs Novolog correction and the glucose levels are maintained in the 200 range for a total of 12 units with no normal range being achieved.  Insulin - Meal Coverage: Pt not eating. Basal needs are high. HgbA1C: Based on elevated A1C of 12.4 %, pt would benefit from addition of Lantus at home as well.  Please order insulin instruction if pt to be discharged home on insulin.  Note: Thank you, Lenor Coffin, RN, CNS Diabetes Coordinator [phone])

## 2011-06-15 NOTE — Discharge Summary (Signed)
Physician Discharge Summary  Patient ID: Marcus Vance MRN: [id number] DOB/AGE: [birth year] 45 y.o.  Admit date: 06/11/2011 Discharge date: 06/15/2011  Discharge Diagnoses:  Principal Problem:  *Fever Active Problems:  Abdominal pain of unknown etiology  Testicular pain  Cirrhosis  Diabetes mellitus  Hypertension  Abnormal CT of the abdomen  Initial History: 45 year old man presents the emergency department with 2 days of lower count and low back pain radiating into his testicle, nausea, diarrhea, fever. Emergency department impression was: abdominal pain, cirrhosis, fever 102.3. The patient was transferred to [facility] for further evaluation. Exam was notable for slight left lower quadrant tenderness. No right upper quadrant tenderness. CT of the abdomen and pelvis was concerning for cirrhosis, edema surrounding the gallbladder, large calcification near the junction of the inferior vena cava and right atrium, prominent lymph nodes in the porta hepatis and surrounding the pancreas, prominent lymph nodes right lower quadrant of the abdomen. HIDA and MRI have not shown any acute findings to explain his pain. GI was following as well.   Hospital Course:  #1 abdominal pain: Etiology for this was not entirely clear. Patient was being worked up for the same. GI was involved. Because the pain was radiating to his testes and scrotum. We consulted urology as well , but the patient left AMA before being seen.  #2 liver cirrhosis of unclear etiology: Workup was ongoing. No cholecystitis was seen on HIDA scan.  #3 fever: This had resolved. Patient was put on antibiotics in the form of Flagyl and ceftriaxone. Once, again, no clear source for his fever was determined. However, the fever resolved.  The rest of his medical conditions were stable. His diabetes and hypertension were closely monitored.  His wife came today, and she was extremely disappointed with the care that the patient had  received. In particular she was concerned, that no diagnosis had been made. She discussed this with me. I tried to explain to her as to what we were doing and what our approach was. However, she did not feel satisfied and so, she decided to take her husband AGAINST MEDICAL ADVICE. Her plan was to take him to [facility].  So, please note that the patient left AGAINST MEDICAL ADVICE.    IMAGING STUDIES Ct Abdomen Pelvis Wo Contrast  06/11/2011  *RADIOLOGY REPORT*  Clinical Data: Flank pain radiating to the testicle.  CT ABDOMEN AND PELVIS WITHOUT CONTRAST  Technique:  Multidetector CT imaging of the abdomen and pelvis was performed following the standard protocol without intravenous contrast.  Comparison: None.  Findings: Lung bases are clear.  There is a large calcification near the junction of the inferior vena cava and right atrium. This calcified structure roughly measures 2.6 x 3.1 x 3.6 cm.  It is unclear if the calcification is within the vascular structures. There are calcifications surrounding the lower lobe vessels which could represent old granulomatous disease.  There is no significant pericardial or pleural fluid.  There is mild nodularity of the liver and enlargement of the caudate lobe of the liver.  Findings raise concern for cirrhosis.  The gallbladder is ill-defined and probably has surrounding fluid or edema.  There is a small amount of fluid around the inferior aspect of the liver and a small amount of fluid in the pelvis.  Normal appearance of the adrenal glands and kidneys.  No evidence for kidney stones or hydronephrosis.  Spleen is normal in morphology and size.  No gross abnormality to the pancreas but there is  stranding and prominent lymph nodes in the porta hepatis and surrounding the pancreas.  There is an umbilical and supraumbilical ventral hernia containing fat.  No gross abnormality to the urinary bladder.  Prominent lymph nodes in the right lower quadrant of the  abdomen.  Largest node in the right lower mesentery measures 1.7 x 1.6 cm on sequence 93, image 61.  The patient has had a median sternotomy.  No acute bone abnormality.  IMPRESSION: Findings raise concern for cirrhosis.  There is mild nodularity of the liver contour and enlargement of the caudate lobe.  In addition, there is stranding in the porta hepatis with enlarged lymph nodes in the porta hepatis and the right lower abdominal mesentery.  Stranding or edema around the gallbladder.  Findings could be secondary to cirrhosis but cannot exclude acute gallbladder inflammation.  There is a large calcification positioned near the junction of the right atrium and inferior vena cava.  It is unclear if this calcification is intravascular or extrinsic to the vascular structures.  Differential could represent chronic thrombus versus old granulomatous disease.  The liver and gallbladder findings may be further evaluated with an abdominal ultrasound and  Doppler.  Alternately, the liver, hepatic veins and IVC may be further evaluated with a multiphase MRI of the liver.  Ventral hernias containing fat.  Original Report Authenticated By: Richarda Overlie, M.D.   Dg Chest 2 View  06/11/2011  *RADIOLOGY REPORT*  Clinical Data: Abdominal pain for 2 days  CHEST - 2 VIEW  Comparison: CT abdomen pelvis 06/11/2011  Findings: There are changes of median sternotomy. The second and third median sternotomy wires are fractured. Lung volumes are low, with crowding of pulmonary vascularity.  Heart size is within normal limits. The lungs are clear.  There is no pleural effusion or pneumothorax.  Visualized osseous structures are unremarkable.  IMPRESSION: Low lung volumes.  No acute findings.  Original Report Authenticated By: Britta Mccreedy, M.D.   Nm Hepatobiliary Liver Func  06/12/2011  *RADIOLOGY REPORT*  Clinical Data:  Concern for cholecystitis.  NUCLEAR MEDICINE HEPATOBILIARY IMAGING  Technique:  Sequential images of the abdomen were  obtained out to 60 minutes following intravenous administration of radiopharmaceutical.  Radiopharmaceutical:  4.5 mCi Tc-47m Choletec  Comparison:  Ultrasound 06/11/2011  Findings: There is prompt clearance of radiotracer from the blood pool and homogeneous uptake in the liver.  The gallbladder is evident by 30 minutes.  Counts evident in the bowel by 15 minutes.  IMPRESSION: No evidence of cholecystitis.  Patent cystic duct and common bile duct.  Original Report Authenticated By: Genevive Bi, M.D.   US Abdomen Complete  06/11/2011  *RADIOLOGY REPORT*  Clinical Data:  Abdominal pain  ABDOMINAL ULTRASOUND COMPLETE  Comparison:  CT earlier today  Findings:  Gallbladder:  The gallbladder wall is significantly thickened, measuring up to 7.5 mm.  No gallstones or pericholecystic fluid.  Common Bile Duct:  Within normal limits in caliber.  Liver: The liver is diffusely nodular in echogenicity and contour. No obvious focal mass.  IVC:  Appears normal.  Pancreas:  Obscured by overlying bowel gas  Spleen:  Within normal limits in size and echotexture.  Right kidney:  Normal in size and parenchymal echogenicity.  No evidence of mass or hydronephrosis.  Left kidney:  Normal in size and parenchymal echogenicity.  No evidence of mass or hydronephrosis.  Abdominal Aorta:  Obscured by overlying bowel gas.  IMPRESSION: Limited visualization of the pancreas and aorta.  Nonspecific gallbladder wall thickening.  Nodular and  heterogeneous liver worrisome for diffuse hepatic cellular disease or cirrhosis.  Original Report Authenticated By: Donavan Burnet, M.D.   US Scrotum  06/12/2011  *RADIOLOGY REPORT*  Clinical Data:  Testicular pain  SCROTAL ULTRASOUND DOPPLER ULTRASOUND OF THE TESTICLES  Technique: Complete ultrasound examination of the testicles, epididymis, and other scrotal structures was performed.  Color and spectral Doppler ultrasound were also utilized to evaluate blood flow to the testicles.  Comparison:  None   Findings: Both testicles have a normal size and echotexture.  The right testicle measures 4.0 x 2.2 x 3.1 cm, the left testicle measures 4.1 x 2.0 x 2.7 cm.  There is no evidence of testicular mass or hydrocele.  Both epididymi have a normal appearance with incidental note of a 5 mm simple cyst on the left.  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  Pulsed Doppler interrogation of both testes demonstrates symmetric, low resistance flow bilaterally.  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  Original Report Authenticated By: Brandon Melnick, M.D.   Mr Liver W Wo Contrast  06/13/2011  *RADIOLOGY REPORT*  Clinical Data: Cirrhosis.  IVC calcification.  MRI ABDOMEN WITH AND WITHOUT CONTRAST  Technique:  Multiplanar multisequence MR imaging of the abdomen was performed both before and after administration of intravenous contrast.  Contrast: 20mL MULTIHANCE GADOBENATE DIMEGLUMINE 529 MG/ML IV SOLN  Comparison: CT scan 06/11/2011.  Findings: There are cirrhotic changes involving the liver with an irregular contour or and increased caudate to right lobe ratio. There is of hepatic fibrosis are noted but no focal worrisome hepatic mass lesion.  No intrahepatic biliary dilatation.  The gallbladder is unremarkable.  No gallstones or inflammatory changes.  No common bile duct dilatation.  There are dense calcification involving the IVC as noted on the CT scan.  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  However, the hepatic veins are patent and although the IVC lumen is markedly narrowed the IVC is still patent. There are fairly extensive paraspinal collaterals and azygos venous prominence.  There are portal venous collaterals.  The portal and splenic veins are patent. There are enlarged periportal and celiac axis lymph nodes likely due to cirrhosis.  The pancreas is normal.  The spleen is normal in size.  No focal lesions.  Adrenal glands and kidneys are unremarkable.  A  right renal cyst and scarring change are noted.  The aorta is normal in caliber.  The major branch vessels are patent.  IMPRESSION:  1.  Cirrhotic changes without worrisome hepatic lesions. 2.  Portal venous collaterals but no splenomegaly or ascites.  The portal and splenic veins are patent. 3.  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  The hepatic veins are patent.  Paraspinal and azygos collaterals are noted.  Original Report Authenticated By: P. Loralie Champagne, M.D.   [place] Art/ven Flow Abd Pelv Doppler  06/12/2011  *RADIOLOGY REPORT*  Clinical Data:  Testicular pain  SCROTAL ULTRASOUND DOPPLER ULTRASOUND OF THE TESTICLES  Technique: Complete ultrasound examination of the testicles, epididymis, and other scrotal structures was performed.  Color and spectral Doppler ultrasound were also utilized to evaluate blood flow to the testicles.  Comparison:  None  Findings: Both testicles have a normal size and echotexture.  The right testicle measures 4.0 x 2.2 x 3.1 cm, the left testicle measures 4.1 x 2.0 x 2.7 cm.  There is no evidence of testicular mass or hydrocele.  Both epididymi have a normal  appearance with incidental note of a 5 mm simple cyst on the left.  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  Pulsed Doppler interrogation of both testes demonstrates symmetric, low resistance flow bilaterally.  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  Original Report Authenticated By: Brandon Melnick, M.D.    Disposition: Left Against Medical Advice   Signed: Osvaldo Shipper 06/15/2011, 4:47 PM

## 2011-06-15 NOTE — Progress Notes (Signed)
PROGRESS NOTE  Marcus Vance WUJ:[id number] DOB: 09-Nov-1965 DOA: 06/11/2011 PCP: [facility]  Brief narrative: 45 year old man presents the emergency department with 2 days of lower count and low back pain radiating into his testicle, nausea, diarrhea, fever. Emergency department impression was: abdominal pain, cirrhosis, fever 102.3. The patient was transferred to [facility] for further evaluation. Exam was notable for slight left lower quadrant tenderness. No right upper quadrant tenderness. CT of the abdomen and pelvis was concerning for cirrhosis, edema surrounding the gallbladder, large calcification near the junction of the inferior vena cava and right atrium, prominent lymph nodes in the porta hepatis and surrounding the pancreas, prominent lymph nodes right lower quadrant of the abdomen. HIDA and MRI have not shown any acute findings to explain his pain. GI is following as well.  Past medical history: Hypertension, diabetes mellitus, hiatal hernia, obstructive sleep apnea, GERD  Consultants:  Gastroenterology  Procedures:  None  Interim History: Interim documentation reviewed.   Subjective: No diarrhea. Still with lower abdominal pain radiating to groin. No nausea or vomiting. Most pain located in supra-pubic area. Denies any dysuria or hematuria.  Objective: Filed Vitals:   06/14/11 1345 06/14/11 1700 06/14/11 2100 06/15/11 0445  BP: 116/85 145/97 114/81 114/81  Pulse: 91 94 96 97  Temp: 97.9 F (36.6 C) 98.1 F (36.7 C) 98.2 F (36.8 C) 98.6 F (37 C)  TempSrc: Oral Oral Oral Oral  Resp: 17 18 20 20   Height:      Weight:      SpO2: 93% 92% 99% 93%     Intake/Output Summary (Last 24 hours) at 06/15/11 1011 Last data filed at 06/15/11 0936  Gross per 24 hour  Intake   3400 ml  Output      1 ml  Net   3399 ml    Exam: General: Calm. In no distress. Cardiovascular: Tachycardic, regular rhythm. No murmur, rub or gallop.  Respiratory:  Clear to auscultation bilaterally. No wheezes, rales or rhonchi. Normal respiratory effort. Abdomen: Soft, obese, nondistended. Mild tenderness to palpation of the suprapubic area and low midline abdomen. However no guarding or rebound. BS present.  Neurological: Alert and oriented x3. No focal deficits. Skin: No rash GU: No obvious abnormality noted.   Data Reviewed: Basic Metabolic Panel:  Lab 06/15/11 9562 06/14/11 0600 06/13/11 0630 06/12/11 0600 06/11/11 1340  NA 137 130* 125* 133* 133*  K 3.5 3.7 -- -- --  CL 104 97 93* 101 95*  CO2 23 20 19 21 25   GLUCOSE 228* 223* 336* 291* 394*  BUN 9 15 15 12 8   CREATININE 0.81 1.05 1.51* 1.03 0.60  CALCIUM 8.2* 8.1* 8.1* 8.4 9.3  MG -- -- -- 1.2* --  PHOS -- -- -- 3.8 --   Liver Function Tests:  Lab 06/14/11 0600 06/13/11 0630 06/12/11 0600 06/11/11 1340  AST 33 25 18 19   ALT 21 19 16 19   ALKPHOS 163* 157* 153* 248*  BILITOT 0.6 0.9 1.7* 0.8  PROT 6.9 7.5 7.0 8.3  ALBUMIN 2.6* 2.7* 3.0* 3.7    Lab 06/11/11 1340  LIPASE 20  AMYLASE --   CBC:  Lab 06/15/11 0536 06/14/11 0600 06/13/11 0630 06/12/11 0600 06/11/11 1340  WBC 5.9 6.5 7.7 10.9* 9.7  NEUTROABS -- -- -- -- 7.3  HGB 13.9 14.1 14.6 14.6 15.3  HCT 40.5 42.6 43.2 43.4 46.1  MCV 81.7 82.4 83.1 83.6 82.5  PLT 167 138* 124* 141* 149*   Cardiac Enzymes:  Lab 06/11/11 1846  CKTOTAL 84  CKMB 1.8  CKMBINDEX --  TROPONINI <0.30   CBG:  Lab 06/15/11 0759 06/15/11 0237 06/14/11 2124 06/14/11 1708 06/14/11 1345  GLUCAP 219* 256* 262* 205* 244*    Studies: Dg Chest 2 View  06/11/2011  *RADIOLOGY REPORT*  Clinical Data: Abdominal pain for 2 days  CHEST - 2 VIEW  Comparison: CT abdomen pelvis 06/11/2011  Findings: There are changes of median sternotomy. The second and third median sternotomy wires are fractured. Lung volumes are low, with crowding of pulmonary vascularity.  Heart size is within normal limits. The lungs are clear.  There is no pleural effusion or  pneumothorax.  Visualized osseous structures are unremarkable.  IMPRESSION: Low lung volumes.  No acute findings.  Original Report Authenticated By: Britta Mccreedy, M.D.   Nm Hepatobiliary Liver Func  06/12/2011  *RADIOLOGY REPORT*  Clinical Data:  Concern for cholecystitis.  NUCLEAR MEDICINE HEPATOBILIARY IMAGING  Technique:  Sequential images of the abdomen were obtained out to 60 minutes following intravenous administration of radiopharmaceutical.  Radiopharmaceutical:  4.5 mCi Tc-10m Choletec  Comparison:  Ultrasound 06/11/2011  Findings: There is prompt clearance of radiotracer from the blood pool and homogeneous uptake in the liver.  The gallbladder is evident by 30 minutes.  Counts evident in the bowel by 15 minutes.  IMPRESSION: No evidence of cholecystitis.  Patent cystic duct and common bile duct.  Original Report Authenticated By: Genevive Bi, M.D.   US Scrotum  06/12/2011  *RADIOLOGY REPORT*  Clinical Data:  Testicular pain  SCROTAL ULTRASOUND DOPPLER ULTRASOUND OF THE TESTICLES  Technique: Complete ultrasound examination of the testicles, epididymis, and other scrotal structures was performed.  Color and spectral Doppler ultrasound were also utilized to evaluate blood flow to the testicles.  Comparison:  None  Findings: Both testicles have a normal size and echotexture.  The right testicle measures 4.0 x 2.2 x 3.1 cm, the left testicle measures 4.1 x 2.0 x 2.7 cm.  There is no evidence of testicular mass or hydrocele.  Both epididymi have a normal appearance with incidental note of a 5 mm simple cyst on the left.  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  Pulsed Doppler interrogation of both testes demonstrates symmetric, low resistance flow bilaterally.  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  Original Report Authenticated By: Brandon Melnick, M.D.   Mr Liver W Wo Contrast  06/13/2011  *RADIOLOGY REPORT*  Clinical Data: Cirrhosis.   IVC calcification.  MRI ABDOMEN WITH AND WITHOUT CONTRAST  Technique:  Multiplanar multisequence MR imaging of the abdomen was performed both before and after administration of intravenous contrast.  Contrast: 20mL MULTIHANCE GADOBENATE DIMEGLUMINE 529 MG/ML IV SOLN  Comparison: CT scan 06/11/2011.  Findings: There are cirrhotic changes involving the liver with an irregular contour or and increased caudate to right lobe ratio. There is of hepatic fibrosis are noted but no focal worrisome hepatic mass lesion.  No intrahepatic biliary dilatation.  The gallbladder is unremarkable.  No gallstones or inflammatory changes.  No common bile duct dilatation.  There are dense calcification involving the IVC as noted on the CT scan.  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  However, the hepatic veins are patent and although the IVC lumen is markedly narrowed the IVC is still patent. There are fairly extensive paraspinal collaterals and azygos venous prominence.  There are portal venous collaterals.  The portal and splenic veins are patent. There are enlarged periportal and celiac  axis lymph nodes likely due to cirrhosis.  The pancreas is normal.  The spleen is normal in size.  No focal lesions.  Adrenal glands and kidneys are unremarkable.  A right renal cyst and scarring change are noted.  The aorta is normal in caliber.  The major branch vessels are patent.  IMPRESSION:  1.  Cirrhotic changes without worrisome hepatic lesions. 2.  Portal venous collaterals but no splenomegaly or ascites.  The portal and splenic veins are patent. 3.  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  The hepatic veins are patent.  Paraspinal and azygos collaterals are noted.  Original Report Authenticated By: P. Loralie Champagne, M.D.   Scheduled Meds:    . cefTRIAXone (ROCEPHIN)  IV  1 g Intravenous Q24H  . insulin aspart  0-9 Units Subcutaneous Q4H  . insulin glargine  15 Units  Subcutaneous Daily  . metronidazole  500 mg Intravenous Q6H  . DISCONTD: insulin glargine  10 Units Subcutaneous Daily  . DISCONTD: magnesium oxide  400 mg Oral BID   Continuous Infusions:    . sodium chloride 150 mL/hr at 06/14/11 2327     Assessment/Plan: 1. Fever: Etiology unclear, now resolved. On ceftriaxone and flagyl empirically. Consider change to Po once taking orally. 2. Acute renal failure: Resolved. ARB on hold. 3. Abdominal pain: Etiology remains unclear. Patient dis have diarrhea (C. difficile PCR negative) which has resolved. Abdominal ultrasound and HIDA scans were negative. CT of the abdomen and pelvis without contrast was unrevealing and as was MRI of the liver. His abdominal exam however does remain benign. There is no evidence of an acute surgical issue. Cannot exclude diverticulitis at this point although his exam and presentation is not highly suggestive of this. Continue on empiric Rocephin and Flagyl for now. Gastroenterology consultation. Urology consultation. Hold on repeat Ct for now. 4. Hyponatremia: Resolved. 5. Thrombocytopenia: Resolved. Etiology unclear.   6. New diagnosis of cirrhosis: Etiology unclear. Hepatitis serologies negative. Alcohol abuse denied. 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 Gi following. Unlikely to be causing pain. 7. Testicular pain: Resolved. See above. 8. 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 9. Isolated elevation of alkaline phosphatase: This was also seen 09/10/2010. Etiology significance unclear. 10. Diabetes mellitus, uncontrolled: Poor control. Metformin on hold while in hospital. Hold glipizide for now as his oral intake is limited. Increase Lantus. Continue sliding scale every 4 hours.  Code Status: Full  Disposition Plan: Pending further evaluation.   [facility]   [facility] Pager [phone]  06/15/2011, 10:11 AM    LOS: 4 days

## 2011-06-15 NOTE — Progress Notes (Signed)
06/15/2011 Uziah Sorter SPARKS Case Management Note [phone]       Utilization review completed.  

## 2011-06-15 NOTE — Progress Notes (Signed)
Pt. Verbalized his desire to leave against medical advise. MD was notified,MD called pt. And explained that he was not ready to be discharge, pt. And his wife said they still leaving. LAMA papers were read and sign. Pt. Went home few minutes after that. Tennis Must RN

## 2011-06-16 LAB — ANTI-SMOOTH MUSCLE ANTIBODY, IGG: F-Actin IgG: 7 U (ref <20)

## 2011-06-18 LAB — CULTURE, BLOOD (ROUTINE X 2)
Culture  Setup Time: 201212011140
Culture: NO GROWTH

## 2012-05-26 IMAGING — NM NM HEPATOBILIARY IMAGE, INC GB
1 series · 6 of 6 positions shown · non-contrast
Comparison: Ultrasound 06/11/2011

CLINICAL DATA: Concern for cholecystitis.

NUCLEAR MEDICINE HEPATOBILIARY IMAGING
TECHNIQUE: Sequential images of the abdomen were obtained [DATE] minutes following intravenous administration of
radiopharmaceutical.
Radiopharmaceutical:  4.5 mCi 4c-TTm Choletec

[Series 1: he hepatobiliary · 3.43mm/px · 6 of 43 frames shown]
[frame 4/43]
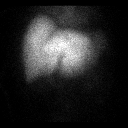
[frame 11/43]
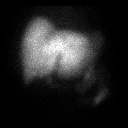
[frame 18/43]
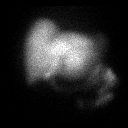
[frame 25/43]
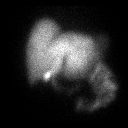
[frame 32/43]
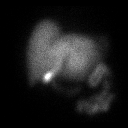
[frame 40/43]
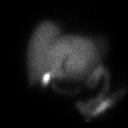

[6 of 6 positions shown; findings below may reference images not displayed]

FINDINGS: There is prompt clearance of radiotracer from the blood
pool and homogeneous uptake in the liver.  The gallbladder is
evident by 30 minutes.  Counts evident in the bowel by 15 minutes.
IMPRESSION: No evidence of cholecystitis.  Patent cystic duct and common bile
duct.

## 2016-07-23 ENCOUNTER — Encounter (HOSPITAL_BASED_OUTPATIENT_CLINIC_OR_DEPARTMENT_OTHER): Payer: Self-pay | Admitting: *Deleted

## 2016-07-23 ENCOUNTER — Emergency Department (HOSPITAL_BASED_OUTPATIENT_CLINIC_OR_DEPARTMENT_OTHER)
Admission: EM | Admit: 2016-07-23 | Discharge: 2016-07-23 | Disposition: A | Discharge status: Home / Self Care | Payer: 59 | Attending: Emergency Medicine | Admitting: Emergency Medicine

## 2016-07-23 DIAGNOSIS — R197 Diarrhea, unspecified: Secondary | ICD-10-CM | POA: Diagnosis present

## 2016-07-23 DIAGNOSIS — I2510 Atherosclerotic heart disease of native coronary artery without angina pectoris: Secondary | ICD-10-CM | POA: Diagnosis not present

## 2016-07-23 DIAGNOSIS — I10 Essential (primary) hypertension: Secondary | ICD-10-CM | POA: Diagnosis not present

## 2016-07-23 DIAGNOSIS — Z7984 Long term (current) use of oral hypoglycemic drugs: Secondary | ICD-10-CM | POA: Insufficient documentation

## 2016-07-23 DIAGNOSIS — Z955 Presence of coronary angioplasty implant and graft: Secondary | ICD-10-CM | POA: Diagnosis not present

## 2016-07-23 DIAGNOSIS — R112 Nausea with vomiting, unspecified: Secondary | ICD-10-CM

## 2016-07-23 DIAGNOSIS — E119 Type 2 diabetes mellitus without complications: Secondary | ICD-10-CM | POA: Diagnosis not present

## 2016-07-23 DIAGNOSIS — F17200 Nicotine dependence, unspecified, uncomplicated: Secondary | ICD-10-CM | POA: Insufficient documentation

## 2016-07-23 LAB — URINALYSIS, ROUTINE W REFLEX MICROSCOPIC
Bilirubin Urine: NEGATIVE
Hgb urine dipstick: NEGATIVE
Ketones, ur: NEGATIVE mg/dL
LEUKOCYTES UA: NEGATIVE
Nitrite: NEGATIVE
PROTEIN: NEGATIVE mg/dL
SPECIFIC GRAVITY, URINE: 1.042 — AB (ref 1.005–1.030)
pH: 6.5 (ref 5.0–8.0)

## 2016-07-23 LAB — BASIC METABOLIC PANEL
ANION GAP: 9 (ref 5–15)
BUN: 11 mg/dL (ref 6–20)
CO2: 21 mmol/L — AB (ref 22–32)
Calcium: 8.3 mg/dL — ABNORMAL LOW (ref 8.9–10.3)
Chloride: 103 mmol/L (ref 101–111)
Creatinine, Ser: 0.71 mg/dL (ref 0.61–1.24)
GFR calc Af Amer: >60 mL/min (ref >60)
GFR calc non Af Amer: >60 mL/min (ref >60)
GLUCOSE: 275 mg/dL — AB (ref 65–99)
Potassium: 3.6 mmol/L (ref 3.5–5.1)
Sodium: 133 mmol/L — ABNORMAL LOW (ref 135–145)

## 2016-07-23 LAB — CBC WITH DIFFERENTIAL/PLATELET
BASOS ABS: 0 10*3/uL (ref 0.0–0.1)
Basophils Relative: 0 %
Eosinophils Absolute: 0.1 10*3/uL (ref 0.0–0.7)
Eosinophils Relative: 2 %
HEMATOCRIT: 45.6 % (ref 39.0–52.0)
Hemoglobin: 15.3 g/dL (ref 13.0–17.0)
LYMPHS PCT: 25 %
Lymphs Abs: 1.9 10*3/uL (ref 0.7–4.0)
MCH: 27.6 pg (ref 26.0–34.0)
MCHC: 33.6 g/dL (ref 30.0–36.0)
MCV: 82.3 fL (ref 78.0–100.0)
MONO ABS: 0.6 10*3/uL (ref 0.1–1.0)
Monocytes Relative: 8 %
NEUTROS ABS: 4.9 10*3/uL (ref 1.7–7.7)
Neutrophils Relative %: 65 %
Platelets: 183 10*3/uL (ref 150–400)
RBC: 5.54 MIL/uL (ref 4.22–5.81)
RDW: 12.9 % (ref 11.5–15.5)
WBC: 7.5 10*3/uL (ref 4.0–10.5)

## 2016-07-23 LAB — URINALYSIS, MICROSCOPIC (REFLEX)

## 2016-07-23 MED ORDER — ONDANSETRON HCL 4 MG PO TABS: 4.0000 mg | ORAL_TABLET | Freq: Three times a day (TID) | ORAL | 0 refills | Status: AC | PRN

## 2016-07-23 MED ORDER — DICYCLOMINE HCL 20 MG PO TABS: 20.0000 mg | ORAL_TABLET | Freq: Two times a day (BID) | ORAL | 0 refills | Status: AC

## 2016-07-23 MED ORDER — LOPERAMIDE HCL 2 MG PO CAPS: 2.0000 mg | ORAL_CAPSULE | Freq: Four times a day (QID) | ORAL | 0 refills | Status: AC | PRN

## 2016-07-23 NOTE — Discharge Instructions (Signed)
SEEK IMMEDIATE MEDICAL CARE IF:   You develop symptoms of dehydration that do not improve with fluid replacement. This may include:  Excessive sleepiness.  Lack of tears.  Dry mouth.  Dizziness when standing.  Weak pulse.

## 2016-07-23 NOTE — ED Notes (Signed)
Pt verbalizes understanding of d/c instructions and denies any further needs at this time. 

## 2016-07-23 NOTE — ED Provider Notes (Signed)
[facility] Provider Note   CSN: [id number] Arrival date & time: 07/23/16  1735  By signing my name below, I, Linna DarnerRussell Turner, attest that this documentation has been prepared under the direction and in the presence of Arthor CaptainAbigail Samyak Sackmann, PA-C. Electronically Signed: Linna Darnerussell Turner, Scribe. 07/23/2016. 8:59 PM.  History   Chief Complaint Chief Complaint  Patient presents with  . Diarrhea    The history is provided by the patient. No language interpreter was used.     HPI Comments: Marcus Vance is a 51 y.o. male who presents to the Emergency Department complaining of persistent diarrhea beginning yesterday. He reports he had some nausea and vomiting yesterday as well but has not had any episodes of either today. He notes his diarrhea is improving but he has had a few episodes today. Pt notes his wife has similar symptoms. He works in [employer] and reports his employer wanted him to be medically cleared to return to work. He denies recent foreign travel or unusual food intake. He further denies fever, chills, or any other associated symptoms.  Past Medical History:  Diagnosis Date  . Coronary artery disease   . Diabetes mellitus   . GERD (gastroesophageal reflux disease)   . Headache(784.0)   . Hiatal hernia   . Hypertension   . Sleep apnea     Patient Active Problem List   Diagnosis Date Noted  . Fever 06/12/2011  . Abdominal pain of unknown etiology 06/12/2011  . Testicular pain 06/12/2011  . Cirrhosis (HCC) 06/12/2011  . Diabetes mellitus 06/12/2011  . Hypertension 06/12/2011  . Abnormal CT of the abdomen 06/12/2011    Past Surgical History:  Procedure Laterality Date  . APPENDECTOMY    . CORONARY ARTERY BYPASS GRAFT         Home Medications    Prior to Admission medications   Medication Sig Start Date End Date Taking? Authorizing Provider  glipiZIDE (GLUCOTROL) 10 MG tablet Take 10 mg by mouth 2 (two) times daily before a meal.     Yes  Historical Provider, MD  metFORMIN (GLUCOPHAGE) 500 MG tablet Take 500 mg by mouth 2 (two) times daily with a meal.     Yes Historical Provider, MD  candesartan (ATACAND) 16 MG tablet Take 16 mg by mouth daily.      Historical Provider, MD  dicyclomine (BENTYL) 20 MG tablet Take 1 tablet (20 mg total) by mouth 2 (two) times daily. 07/23/16   Arthor CaptainAbigail Lamone Ferrelli, PA-C  loperamide (IMODIUM) 2 MG capsule Take 1 capsule (2 mg total) by mouth 4 (four) times daily as needed for diarrhea or loose stools. 07/23/16   Arthor CaptainAbigail Ellesse Antenucci, PA-C  ondansetron (ZOFRAN) 4 MG tablet Take 1 tablet (4 mg total) by mouth every 8 (eight) hours as needed for nausea or vomiting. 07/23/16   Arthor CaptainAbigail Nithin Demeo, PA-C    Family History Family History  Problem Relation Age of Onset  . Hypertension Mother   . Diabetes Mother   . COPD Father     Social History Social History  Substance Use Topics  . Smoking status: Current Some Day Smoker  . Smokeless tobacco: Never Used  . Alcohol use 1.8 oz/week    3 Cans of beer per week     Comment: occ     Allergies   Patient has no known allergies.   Review of Systems Review of Systems  A complete 10 system review of systems was obtained and all systems are negative except as noted in  the HPI and PMH.   Physical Exam Updated Vital Signs BP 125/80 (BP Location: Right Arm)   Pulse 94   Temp 99.6 F (37.6 C) (Oral)   Resp 20   Ht 6' (1.829 m)   Wt 132.9 kg   SpO2 96%   BMI 39.74 kg/m   Physical Exam  Constitutional: He is oriented to person, place, and time. He appears well-developed and well-nourished. No distress.  HENT:  Head: Normocephalic and atraumatic.  Eyes: Conjunctivae and EOM are normal.  Neck: Neck supple. No tracheal deviation present.  Cardiovascular: Normal rate.   Pulmonary/Chest: Effort normal. No respiratory distress.  Musculoskeletal: Normal range of motion.  Neurological: He is alert and oriented to person, place, and time.  Skin: Skin is warm and  dry.  Psychiatric: He has a normal mood and affect. His behavior is normal.  Nursing note and vitals reviewed.    ED Treatments / Results  Labs (all labs ordered are listed, but only abnormal results are displayed) Labs Reviewed  BASIC METABOLIC PANEL - Abnormal; Notable for the following:       Result Value   Sodium 133 (*)    CO2 21 (*)    Glucose, Bld 275 (*)    Calcium 8.3 (*)    All other components within normal limits  URINALYSIS, ROUTINE W REFLEX MICROSCOPIC - Abnormal; Notable for the following:    Specific Gravity, Urine 1.042 (*)    Glucose, UA >=500 (*)    All other components within normal limits  URINALYSIS, MICROSCOPIC (REFLEX) - Abnormal; Notable for the following:    Bacteria, UA RARE (*)    Squamous Epithelial / LPF 0-5 (*)    All other components within normal limits  CBC WITH DIFFERENTIAL/PLATELET    EKG  EKG Interpretation None       Radiology No results found.  Procedures Procedures (including critical care time)  DIAGNOSTIC STUDIES: Oxygen Saturation is 96% on RA, adequate by my interpretation.    COORDINATION OF CARE: 9:04 PM Discussed treatment plan with pt at bedside and pt agreed to plan.  Medications Ordered in ED Medications - No data to display   Initial Impression / Assessment and Plan / ED Course  I have reviewed the triage vital signs and the nursing notes.  Pertinent labs & imaging results that were available during my care of the patient were reviewed by me and considered in my medical decision making (see chart for details).  Clinical Course    Patient with symptoms consistent with viral gastroenteritis.  Vitals are stable, no fever.  No signs of dehydration, tolerating PO fluids > 6 oz.  Lungs are clear.  No focal abdominal pain, no concern for appendicitis, cholecystitis, pancreatitis, ruptured viscus, UTI, kidney stone, or any other abdominal etiology.  Supportive therapy indicated with return if symptoms worsen.   Patient counseled.   Final Clinical Impressions(s) / ED Diagnoses   Final diagnoses:  Nausea vomiting and diarrhea    New Prescriptions Discharge Medication List as of 07/23/2016  9:25 PM    START taking these medications   Details  dicyclomine (BENTYL) 20 MG tablet Take 1 tablet (20 mg total) by mouth 2 (two) times daily., Starting Fri 07/23/2016, Print    loperamide (IMODIUM) 2 MG capsule Take 1 capsule (2 mg total) by mouth 4 (four) times daily as needed for diarrhea or loose stools., Starting Fri 07/23/2016, Print    ondansetron (ZOFRAN) 4 MG tablet Take 1 tablet (4 mg total) by  mouth every 8 (eight) hours as needed for nausea or vomiting., Starting Fri 07/23/2016, Print       I personally performed the services described in this documentation, which was scribed in my presence. The recorded information has been reviewed and is accurate.        Arthor Captain, PA-C 07/24/16 1610    Maia Plan, MD 07/24/16 1046

## 2016-07-23 NOTE — ED Triage Notes (Addendum)
Diarrhea and vomiting last night. Feels better today but not back to normal. Known diabetic
